# Patient Record
Sex: Male | Born: 1955 | Race: White | Hispanic: No | Marital: Married | Smoking: Former smoker
Health system: Southern US, Community
[De-identification: ages and names within clinical notes are randomized; demographics above are authoritative.]

## PROBLEM LIST (undated history)

## (undated) DIAGNOSIS — IMO0001 Reserved for inherently not codable concepts without codable children: Secondary | ICD-10-CM

## (undated) DIAGNOSIS — M1710 Unilateral primary osteoarthritis, unspecified knee: Secondary | ICD-10-CM

## (undated) DIAGNOSIS — R030 Elevated blood-pressure reading, without diagnosis of hypertension: Secondary | ICD-10-CM

## (undated) DIAGNOSIS — G8929 Other chronic pain: Secondary | ICD-10-CM

## (undated) DIAGNOSIS — I10 Essential (primary) hypertension: Secondary | ICD-10-CM

## (undated) DIAGNOSIS — IMO0002 Reserved for concepts with insufficient information to code with codable children: Secondary | ICD-10-CM

## (undated) DIAGNOSIS — F418 Other specified anxiety disorders: Secondary | ICD-10-CM

## (undated) DIAGNOSIS — M9909 Segmental and somatic dysfunction of abdomen and other regions: Secondary | ICD-10-CM

## (undated) DIAGNOSIS — M179 Osteoarthritis of knee, unspecified: Secondary | ICD-10-CM

## (undated) HISTORY — DX: Reserved for inherently not codable concepts without codable children: IMO0001

## (undated) HISTORY — DX: Segmental and somatic dysfunction of abdomen and other regions: M99.09

## (undated) HISTORY — DX: Elevated blood-pressure reading, without diagnosis of hypertension: R03.0

## (undated) HISTORY — DX: Other chronic pain: G89.29

## (undated) HISTORY — DX: Unilateral primary osteoarthritis, unspecified knee: M17.10

## (undated) HISTORY — DX: Reserved for concepts with insufficient information to code with codable children: IMO0002

## (undated) HISTORY — DX: Other specified anxiety disorders: F41.8

## (undated) HISTORY — DX: Osteoarthritis of knee, unspecified: M17.9

---

## 2006-01-19 ENCOUNTER — Ambulatory Visit (HOSPITAL_COMMUNITY): Admission: RE | Admit: 2006-01-19 | Discharge: 2006-01-19 | Payer: Self-pay | Admitting: Gastroenterology

## 2006-01-19 ENCOUNTER — Ambulatory Visit: Payer: Self-pay | Admitting: Gastroenterology

## 2006-01-19 ENCOUNTER — Encounter (INDEPENDENT_AMBULATORY_CARE_PROVIDER_SITE_OTHER): Payer: Self-pay | Admitting: Specialist

## 2007-02-23 ENCOUNTER — Encounter: Admission: RE | Admit: 2007-02-23 | Discharge: 2007-02-23 | Payer: Self-pay | Admitting: Sports Medicine

## 2007-03-19 ENCOUNTER — Encounter: Admission: RE | Admit: 2007-03-19 | Discharge: 2007-03-19 | Payer: Self-pay | Admitting: Sports Medicine

## 2010-11-15 NOTE — Op Note (Signed)
NAMEASHFORD, CLOUSE               ACCOUNT NO.:  [id number]   MEDICAL RECORD NO.:  [id number]          PATIENT TYPE:  AMB   LOCATION:  DAY                           FACILITY:  [facility]   PHYSICIAN:  Kassie Mends, M.D.      DATE OF BIRTH:  [birth year]   DATE OF PROCEDURE:  01/19/2006  DATE OF DISCHARGE:                                  PROCEDURE NOTE   REFERRING PHYSICIAN:  Dr. Charm Barges.   PROCEDURE:  Colonoscopy with cold forceps biopsy.   ENDOSCOPIST:  Kassie Mends, M.D.   INDICATION FOR EXAM:  Mr. Samuel Grant is a 55 year old male who has a first-degree  relative with colon cancer diagnosed at age greater than 15.  He has a  second-degree relative diagnosed with colon cancer at age greater than 63.   MEDICATIONS:  1.  Demerol 100 mg IV.  2.  Versed 8 mg IV.   FINDINGS:  1.  A 4-mm colon polyp removed via cold forceps biopsy.  2.  Sigmoid diverticulosis.  3.  No inflammatory changes, masses or vascular ectasias seen.  4.  Normal retroflexed view of the rectum.   RECOMMENDATIONS:  1.  Follow up biopsies.  Will need a screening colonoscopy in 5 years if      polyps are adenomatous.  Otherwise, he may be screened at an interval of      every 10 years.  2.  Handout given on high-fiber diet and diverticulosis.  3.  Follow up with Dr. Charm Barges.   PROCEDURAL TECHNIQUE:  Physical exam was performed and informed consent was  obtained from the patient after explaining all risks, benefits and  alternatives to the procedure, which the patient appeared to understand and  so stated.  The patient was connected to the monitoring device and placed in  the left lateral position.  Continuous oxygen was provided by nasal cannula  and IV medicine administered through an indwelling cannula.  After  administration of sedation and a rectal exam, the patient's rectum was  intubated and the scope was advanced under  direct visualization to the cecum.  Scope was subsequently removed slowly by  carefully examining  the color, texture, anatomy and integrity of the mucosa  on the way out.  The patient was recovered in the endoscopy suite and  discharged to home in satisfactory condition.      Kassie Mends, M.D.  Electronically Signed     SM/MEDQ  D:  01/20/2006  T:  01/21/2006  Job:  [id number]   cc:   Samuel Jester  Fax: [phone]

## 2012-12-22 ENCOUNTER — Encounter: Payer: Self-pay | Admitting: Cardiology

## 2012-12-28 ENCOUNTER — Encounter: Payer: Self-pay | Admitting: *Deleted

## 2012-12-28 ENCOUNTER — Ambulatory Visit (INDEPENDENT_AMBULATORY_CARE_PROVIDER_SITE_OTHER): Payer: BC Managed Care – PPO | Admitting: Cardiology

## 2012-12-28 ENCOUNTER — Encounter: Payer: Self-pay | Admitting: Cardiology

## 2012-12-28 VITALS — BP 124/80 | HR 76 | Ht 73.0 in | Wt 223.1 lb

## 2012-12-28 DIAGNOSIS — R0609 Other forms of dyspnea: Secondary | ICD-10-CM

## 2012-12-28 DIAGNOSIS — Z136 Encounter for screening for cardiovascular disorders: Secondary | ICD-10-CM

## 2012-12-28 DIAGNOSIS — G4733 Obstructive sleep apnea (adult) (pediatric): Secondary | ICD-10-CM

## 2012-12-28 DIAGNOSIS — M25519 Pain in unspecified shoulder: Secondary | ICD-10-CM

## 2012-12-28 DIAGNOSIS — R0602 Shortness of breath: Secondary | ICD-10-CM

## 2012-12-28 MED ORDER — ASPIRIN EC 81 MG PO TBEC: 81.0000 mg | DELAYED_RELEASE_TABLET | Freq: Every day | ORAL | Status: DC

## 2012-12-28 NOTE — Patient Instructions (Signed)
   Begin Aspirin 81mg  daily  Treadmill Myoview (stress test) - to be done at [facility] - to be done here in [place] Continue all other current medications. Follow up in  2 weeks

## 2012-12-28 NOTE — Progress Notes (Signed)
Patient ID: Samuel Grant, male   DOB: 1956/05/02, 57 y.o.   MRN: [id number] PCP: Dr. Charm Barges  57 yo with history of HTN presents for evaluation of exertional dyspnea.  Over the last 6 months, patient has developed exertional dyspnea.  He does not smoke.  He is short of breath after walking about 1 block or walking up an incline.  His wife has been noticing it as well.  No chest pain.  He does get pain in his left shoulder on occasion, usually when it is cold outside (history of shoulder surgery).  No chest pain.  No orthopnea/PND. No palpitations.  He does report fatigue during the day and daytime sleepiness. He does snore.  He tried his wife's CPAP but it did not help. He additionally has left knee pain and has been told that he will eventually need TKR.  Of note, his brother had an MI in his 68s.  No CAD history in parents.   ECG: NSR, normal  PMH: 1. Depression 2. HTN 3. Degenerative disc disease 4. OA knees 5. H/o shoulder surgery  SH: Lives in [place], nonsmoker, truck-driver, married  FH: Brother with MI in his 56s  ROS: All systems reviewed and negative except as per HPI  Current Outpatient Prescriptions  Medication Sig Dispense Refill  . ALPRAZolam (XANAX) 1 MG tablet Take 1 mg by mouth 4 (four) times daily.      . cyclobenzaprine (FLEXERIL) 10 MG tablet Take 10 mg by mouth 3 (three) times daily as needed for muscle spasms.      Marland Kitchen ibuprofen (ADVIL,MOTRIN) 800 MG tablet Take 800 mg by mouth 3 (three) times daily.      Marland Kitchen oxyCODONE-acetaminophen (PERCOCET) 10-325 MG per tablet Take 1 tablet by mouth every 4 (four) hours as needed for pain.      . ramipril (ALTACE) 5 MG capsule Take 5 mg by mouth daily.      . Vortioxetine HBr (BRINTELLIX) 10 MG TABS Take 10 mg by mouth daily.      Marland Kitchen aspirin EC 81 MG tablet Take 1 tablet (81 mg total) by mouth daily.       No current facility-administered medications for this visit.    BP 124/80  Pulse 76  Ht 6\' 1"  (1.854 m)  Wt 223 lb 1.9  oz (101.207 kg)  BMI 29.44 kg/m2 General: NAD Neck: No JVD, no thyromegaly or thyroid nodule.  Lungs: Clear to auscultation bilaterally with normal respiratory effort. CV: Nondisplaced PMI.  Heart regular S1/S2, soft S4, no murmur.  No peripheral edema.  No carotid bruit.  Normal pedal pulses.  Abdomen: Soft, nontender, no hepatosplenomegaly, no distention.  Skin: Intact without lesions or rashes.  Neurologic: Alert and oriented x 3.  Psych: Normal affect. Extremities: No clubbing or cyanosis.  HEENT: Normal.   Assessment/Plan: 1. Exertional dyspnea: Patient has new exertional dyspnea of uncertain etiology.  He has not gained a lot of weight recently.  He has not become more sedentary.  He is not volume overloaded on exam.  He does have cardiac risk factors: age, gender, family history, and HTN.   - Risk stratify with ETT-Myoview (thinks he will be able to walk).  - Echo given dyspnea - Would start ASA 81 mg daily - Will try to get copy of recent lipids from Dr. Charm Barges. 2. OSA: Suspected based on symptoms.  He does not want a sleep study at this time.   3. HTN: BP controlled on current regimen.   Treyvion Durkee  Shirlee Latch 12/28/2012

## 2012-12-30 ENCOUNTER — Other Ambulatory Visit: Payer: Self-pay

## 2012-12-30 ENCOUNTER — Other Ambulatory Visit (INDEPENDENT_AMBULATORY_CARE_PROVIDER_SITE_OTHER): Payer: BC Managed Care – PPO

## 2012-12-30 DIAGNOSIS — R0602 Shortness of breath: Secondary | ICD-10-CM

## 2013-01-03 ENCOUNTER — Ambulatory Visit (HOSPITAL_COMMUNITY): Payer: BC Managed Care – PPO | Attending: Cardiology | Admitting: Radiology

## 2013-01-03 ENCOUNTER — Encounter: Payer: Self-pay | Admitting: *Deleted

## 2013-01-03 VITALS — BP 125/80 | HR 72 | Ht 73.0 in | Wt 228.0 lb

## 2013-01-03 DIAGNOSIS — Z8249 Family history of ischemic heart disease and other diseases of the circulatory system: Secondary | ICD-10-CM | POA: Insufficient documentation

## 2013-01-03 DIAGNOSIS — Z01810 Encounter for preprocedural cardiovascular examination: Secondary | ICD-10-CM | POA: Insufficient documentation

## 2013-01-03 DIAGNOSIS — R0989 Other specified symptoms and signs involving the circulatory and respiratory systems: Secondary | ICD-10-CM | POA: Insufficient documentation

## 2013-01-03 DIAGNOSIS — R0789 Other chest pain: Secondary | ICD-10-CM | POA: Insufficient documentation

## 2013-01-03 DIAGNOSIS — R0602 Shortness of breath: Secondary | ICD-10-CM | POA: Insufficient documentation

## 2013-01-03 DIAGNOSIS — Z87891 Personal history of nicotine dependence: Secondary | ICD-10-CM | POA: Insufficient documentation

## 2013-01-03 DIAGNOSIS — R079 Chest pain, unspecified: Secondary | ICD-10-CM

## 2013-01-03 DIAGNOSIS — I10 Essential (primary) hypertension: Secondary | ICD-10-CM | POA: Insufficient documentation

## 2013-01-03 DIAGNOSIS — R5381 Other malaise: Secondary | ICD-10-CM | POA: Insufficient documentation

## 2013-01-03 DIAGNOSIS — M25519 Pain in unspecified shoulder: Secondary | ICD-10-CM | POA: Insufficient documentation

## 2013-01-03 DIAGNOSIS — R0609 Other forms of dyspnea: Secondary | ICD-10-CM | POA: Insufficient documentation

## 2013-01-03 MED ORDER — TECHNETIUM TC 99M SESTAMIBI GENERIC - CARDIOLITE
33.0000 | Freq: Once | INTRAVENOUS | Status: AC | PRN
  Administered 2013-01-03: 33 via INTRAVENOUS

## 2013-01-03 MED ORDER — TECHNETIUM TC 99M SESTAMIBI GENERIC - CARDIOLITE
11.0000 | Freq: Once | INTRAVENOUS | Status: AC | PRN
  Administered 2013-01-03: 11 via INTRAVENOUS

## 2013-01-03 NOTE — Progress Notes (Addendum)
[facility] SITE 3 NUCLEAR MED [address] [place], [place] [address] [phone]    Cardiology Nuclear Med Study  Samuel Grant is a 57 y.o. male     MRN : [id number]     DOB: 1956/03/29  Procedure Date: 01/03/2013  Nuclear Med Background Indication for Stress Test:  Evaluation for Ischemia and Pending Clearance for (L) Knee Surgery by Dr. Salvatore Marvel, Date:TBD History:  ~16 yrs ago GXT:OK per patient; 12/30/12 Echo:EF=60% Cardiac Risk Factors: Family History - CAD, History of Smoking and Hypertension  Symptoms:  Chest Tightness and (L) Shoulder Pain with and without Exertion (last episode of chest discomfort was about a week ago), DOE, Fatigue and SOB   Nuclear Pre-Procedure Caffeine/Decaff Intake:  None > 12 hrs NPO After: 5:30am sip 7-up   Lungs:  Clear. O2 Sat: 98% on room air. IV 0.9% NS with Angio Cath:  22g  IV Site: R Antecubital x 1, tolerated well IV Started by:  Samuel Hong, RN  Chest Size (in):  44 Cup Size: n/a  Height: 6\' 1"  (1.854 m)  Weight:  228 lb (103.42 kg)  BMI:  Body mass index is 30.09 kg/(m^2). Tech Comments:  Took Altace this am    Nuclear Med Study 1 or 2 day study: 1 day  Stress Test Type:  Stress  Reading MD: Samuel Clement, MD  Order Authorizing Provider:  Marca Ancona, MD  Resting Radionuclide: Technetium 72m Sestamibi  Resting Radionuclide Dose: 11.0 mCi   Stress Radionuclide:  Technetium 56m Sestamibi  Stress Radionuclide Dose: 33.0 mCi           Stress Protocol Rest HR: 72 Stress HR: 146  Rest BP: 125/80 Stress BP: 154/67  Exercise Time (min): 5:59 METS: 7.0   Predicted Max HR: 164 bpm % Max HR: 89.02 bpm Rate Pressure Product: 78469   Dose of Adenosine (mg):  n/a Dose of Lexiscan: n/a mg  Dose of Atropine (mg): n/a Dose of Dobutamine: n/a mcg/kg/min (at max HR)  Stress Test Technologist: Samuel Grant, CMA-N  Nuclear Technologist:  Samuel Grant, CNMT     Rest Procedure:  Myocardial perfusion imaging was  performed at rest 45 minutes following the intravenous administration of Technetium 41m Sestamibi.  Rest ECG: NSR - Normal EKG  Stress Procedure:  The patient exercised on the treadmill utilizing the Bruce Protocol for 5:59 minutes. The patient stopped due to fatigue, dyspnea and (B) calf pain, R>L.  He denied any chest pain.  Technetium 53m Sestamibi was injected at peak exercise and myocardial perfusion imaging was performed after a brief delay.  Stress ECG: No significant change from baseline ECG  QPS Raw Data Images:  Normal; no motion artifact; normal heart/lung ratio. Stress Images:  Normal homogeneous uptake in all areas of the myocardium. Rest Images:  Normal homogeneous uptake in all areas of the myocardium. Subtraction (SDS):  No evidence of ischemia. Transient Ischemic Dilatation (Normal <1.22):  n/a Lung/Heart Ratio (Normal <0.45):  0.46  Quantitative Gated Spect Images QGS EDV:  71 ml QGS ESV:  28 ml  Impression Exercise Capacity:  Fair exercise capacity. BP Response:  Normal blood pressure response. Clinical Symptoms:  No significant symptoms noted. ECG Impression:  No significant ST segment change suggestive of ischemia. Comparison with Prior Nuclear Study: No previous nuclear study performed  Overall Impression:  Normal stress nuclear study.  LV Ejection Fraction: 61%.  LV Wall Motion:  NL LV Function; NL Wall Motion   Samuel Grant  Normal study,  please inform patient.   Samuel Grant 01/04/2013

## 2013-01-05 NOTE — Progress Notes (Signed)
Patient notified and verbalized understanding.  Has follow up scheduled for July 18 with Gene Serpe, PA.

## 2013-01-14 ENCOUNTER — Encounter: Payer: BC Managed Care – PPO | Admitting: Physician Assistant

## 2013-01-14 NOTE — Progress Notes (Signed)
Primary Cardiologist: Marca Ancona, MD   HPI: Scheduled followup for review of recent diagnostic testing, per Dr. Shirlee Latch, arranged at time of recent OV for further evaluation of new onset DOE.   - Echocardiogram, July 3: EF 55-60%, no focal WMAs; no significant valvular abnormalities   - GXT Cardiolite, July 7: Normal stress nuclear study; EF 61%, NL wall motion  Allergies  Allergen Reactions  . Penicillins     Current Outpatient Prescriptions  Medication Sig Dispense Refill  . ALPRAZolam (XANAX) 1 MG tablet Take 1 mg by mouth 4 (four) times daily.      Marland Kitchen aspirin EC 81 MG tablet Take 1 tablet (81 mg total) by mouth daily.      . cyclobenzaprine (FLEXERIL) 10 MG tablet Take 10 mg by mouth 3 (three) times daily as needed for muscle spasms.      Marland Kitchen ibuprofen (ADVIL,MOTRIN) 800 MG tablet Take 800 mg by mouth 3 (three) times daily.      Marland Kitchen oxyCODONE-acetaminophen (PERCOCET) 10-325 MG per tablet Take 1 tablet by mouth every 4 (four) hours as needed for pain.      . ramipril (ALTACE) 5 MG capsule Take 5 mg by mouth daily.      . Vortioxetine HBr (BRINTELLIX) 10 MG TABS Take 10 mg by mouth daily.       No current facility-administered medications for this visit.    Past Medical History  Diagnosis Date  . DDD (degenerative disc disease)   . Depression with anxiety   . Chronic pain   . DJD (degenerative joint disease) of knee   . Elevated blood pressure   . Somatic dysfunction     No past surgical history on file.  History   Social History  . Marital Status: Married    Spouse Name: N/A    Number of Children: N/A  . Years of Education: N/A   Occupational History  . Not on file.   Social History Main Topics  . Smoking status: Former Smoker -- 3.00 packs/day for 20 years    Types: Cigarettes    Quit date: 12/28/1972  . Smokeless tobacco: Not on file  . Alcohol Use: No  . Drug Use: Not on file  . Sexually Active: Not on file   Other Topics Concern  . Not on file    Social History Narrative  . No narrative on file    No family history on file.  ROS: no nausea, vomiting; no fever, chills; no melena, hematochezia; no claudication  PHYSICAL EXAM: There were no vitals taken for this visit. GENERAL: 57 year old male; NAD HEENT: NCAT, PERRLA, EOMI; sclera clear; no xanthelasma NECK: palpable bilateral carotid pulses, no bruits; no JVD; no TM LUNGS: CTA bilaterally CARDIAC: RRR (S1, S2); no significant murmurs; no rubs or gallops ABDOMEN: soft, non-tender; intact BS EXTREMETIES: intact distal pulses; no significant peripheral edema SKIN: warm/dry; no obvious rash/lesions MUSCULOSKELETAL: no joint deformity NEURO: no focal deficit; NL affect   EKG: reviewed and available in Electronic Records   ASSESSMENT & PLAN:  No problem-specific assessment & plan notes found for this encounter.   Gene Mirielle Byrum, PAC

## 2015-12-05 ENCOUNTER — Telehealth: Payer: Self-pay | Admitting: Gastroenterology

## 2015-12-05 NOTE — Telephone Encounter (Signed)
Pt is on the July recall for his 10 yr colonoscopy °

## 2015-12-05 NOTE — Telephone Encounter (Signed)
Letter mailed to pt.  

## 2017-09-15 ENCOUNTER — Emergency Department (HOSPITAL_COMMUNITY)
Admission: EM | Admit: 2017-09-15 | Discharge: 2017-09-15 | Disposition: A | Discharge status: Home / Self Care | Payer: Worker's Compensation | Attending: Emergency Medicine | Admitting: Emergency Medicine

## 2017-09-15 ENCOUNTER — Other Ambulatory Visit: Payer: Self-pay

## 2017-09-15 ENCOUNTER — Emergency Department (HOSPITAL_COMMUNITY): Payer: Worker's Compensation

## 2017-09-15 ENCOUNTER — Encounter (HOSPITAL_COMMUNITY): Payer: Self-pay | Admitting: Emergency Medicine

## 2017-09-15 DIAGNOSIS — Z23 Encounter for immunization: Secondary | ICD-10-CM | POA: Insufficient documentation

## 2017-09-15 DIAGNOSIS — Y9389 Activity, other specified: Secondary | ICD-10-CM | POA: Insufficient documentation

## 2017-09-15 DIAGNOSIS — S2242XA Multiple fractures of ribs, left side, initial encounter for closed fracture: Secondary | ICD-10-CM | POA: Insufficient documentation

## 2017-09-15 DIAGNOSIS — Y92488 Other paved roadways as the place of occurrence of the external cause: Secondary | ICD-10-CM | POA: Insufficient documentation

## 2017-09-15 DIAGNOSIS — S299XXA Unspecified injury of thorax, initial encounter: Secondary | ICD-10-CM | POA: Insufficient documentation

## 2017-09-15 DIAGNOSIS — S0181XA Laceration without foreign body of other part of head, initial encounter: Secondary | ICD-10-CM | POA: Insufficient documentation

## 2017-09-15 DIAGNOSIS — Y999 Unspecified external cause status: Secondary | ICD-10-CM | POA: Diagnosis not present

## 2017-09-15 DIAGNOSIS — S4991XA Unspecified injury of right shoulder and upper arm, initial encounter: Secondary | ICD-10-CM | POA: Diagnosis present

## 2017-09-15 HISTORY — DX: Essential (primary) hypertension: I10

## 2017-09-15 MED ORDER — HYDROCODONE-ACETAMINOPHEN 5-325 MG PO TABS: 2.0000 | ORAL_TABLET | ORAL | 0 refills | Status: DC | PRN

## 2017-09-15 MED ORDER — BACITRACIN ZINC 500 UNIT/GM EX OINT
TOPICAL_OINTMENT | CUTANEOUS | Status: AC
  Filled 2017-09-15: qty 0.9

## 2017-09-15 MED ORDER — OXYCODONE-ACETAMINOPHEN 5-325 MG PO TABS
2.0000 | ORAL_TABLET | Freq: Once | ORAL | Status: AC
  Administered 2017-09-15: 2 via ORAL
  Filled 2017-09-15: qty 2

## 2017-09-15 MED ORDER — IBUPROFEN 600 MG PO TABS: 600.0000 mg | ORAL_TABLET | Freq: Four times a day (QID) | ORAL | 0 refills | Status: AC | PRN

## 2017-09-15 MED ORDER — TETANUS-DIPHTH-ACELL PERTUSSIS 5-2.5-18.5 LF-MCG/0.5 IM SUSP
0.5000 mL | Freq: Once | INTRAMUSCULAR | Status: AC
  Administered 2017-09-15: 0.5 mL via INTRAMUSCULAR
  Filled 2017-09-15: qty 0.5

## 2017-09-15 MED ORDER — LIDOCAINE-EPINEPHRINE 2 %-1:100000 IJ SOLN
10.0000 mL | Freq: Once | INTRAMUSCULAR | Status: DC
  Filled 2017-09-15: qty 10

## 2017-09-15 MED ORDER — LIDOCAINE-EPINEPHRINE (PF) 2 %-1:200000 IJ SOLN
INTRAMUSCULAR | Status: AC
  Filled 2017-09-15: qty 20
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
## 2017-09-15 NOTE — Discharge Instructions (Signed)
Please use the breathing incentive spirometer every hour to help increase your lung volume and avoid pneumonia  Ibuprofen as needed for pain, hydrocodone for severe pain  Emergency department for high fevers increasing chest pain or shortness of breath.  You will need to see her family doctor for ongoing treatment of your rib fractures, these may take up to 6 weeks to heal.  The stitches will need to come out within 5 days.  You may go to your family doctor to have these removed.  If you see increased redness swelling pain or fevers you should seek medical attention immediately.  Please keep topical antibiotics on this wound.

## 2017-09-15 NOTE — ED Notes (Signed)
Antibiotic ointment and dressing applied to forehead.  Pt no distress.
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
## 2017-10-01 ENCOUNTER — Encounter (HOSPITAL_COMMUNITY): Payer: Self-pay | Admitting: *Deleted

## 2017-10-01 ENCOUNTER — Other Ambulatory Visit: Payer: Self-pay

## 2017-10-01 ENCOUNTER — Emergency Department (HOSPITAL_COMMUNITY)
Admission: EM | Admit: 2017-10-01 | Discharge: 2017-10-01 | Disposition: A | Discharge status: Home / Self Care | Payer: Worker's Compensation | Attending: Emergency Medicine | Admitting: Emergency Medicine

## 2017-10-01 ENCOUNTER — Emergency Department (HOSPITAL_COMMUNITY): Payer: Worker's Compensation

## 2017-10-01 DIAGNOSIS — Y9389 Activity, other specified: Secondary | ICD-10-CM | POA: Insufficient documentation

## 2017-10-01 DIAGNOSIS — S42102A Fracture of unspecified part of scapula, left shoulder, initial encounter for closed fracture: Secondary | ICD-10-CM

## 2017-10-01 DIAGNOSIS — Y92410 Unspecified street and highway as the place of occurrence of the external cause: Secondary | ICD-10-CM | POA: Diagnosis not present

## 2017-10-01 DIAGNOSIS — S2242XD Multiple fractures of ribs, left side, subsequent encounter for fracture with routine healing: Secondary | ICD-10-CM | POA: Insufficient documentation

## 2017-10-01 DIAGNOSIS — S4992XA Unspecified injury of left shoulder and upper arm, initial encounter: Secondary | ICD-10-CM | POA: Diagnosis present

## 2017-10-01 DIAGNOSIS — Z87891 Personal history of nicotine dependence: Secondary | ICD-10-CM | POA: Insufficient documentation

## 2017-10-01 DIAGNOSIS — Y990 Civilian activity done for income or pay: Secondary | ICD-10-CM | POA: Insufficient documentation

## 2017-10-01 MED ORDER — IBUPROFEN 800 MG PO TABS
800.0000 mg | ORAL_TABLET | Freq: Once | ORAL | Status: AC
  Administered 2017-10-01: 800 mg via ORAL
  Filled 2017-10-01: qty 1

## 2017-10-01 MED ORDER — MELOXICAM 15 MG PO TABS: 15.0000 mg | ORAL_TABLET | Freq: Every day | ORAL | 0 refills | Status: DC

## 2017-10-01 MED ORDER — DIAZEPAM 5 MG PO TABS
10.0000 mg | ORAL_TABLET | Freq: Once | ORAL | Status: AC
Start: 2017-10-01 — End: 2017-10-01
  Administered 2017-10-01: 10 mg via ORAL
  Filled 2017-10-01: qty 2

## 2017-10-01 MED ORDER — CYCLOBENZAPRINE HCL 10 MG PO TABS: 10.0000 mg | ORAL_TABLET | Freq: Three times a day (TID) | ORAL | 0 refills | Status: DC

## 2017-10-01 MED ORDER — HYDROCODONE-ACETAMINOPHEN 7.5-325 MG PO TABS: 1.0000 | ORAL_TABLET | ORAL | 0 refills | Status: DC | PRN

## 2017-10-01 MED ORDER — ONDANSETRON HCL 4 MG PO TABS
4.0000 mg | ORAL_TABLET | Freq: Once | ORAL | Status: AC
  Administered 2017-10-01: 4 mg via ORAL
  Filled 2017-10-01: qty 1

## 2017-10-01 MED ORDER — HYDROCODONE-ACETAMINOPHEN 5-325 MG PO TABS
2.0000 | ORAL_TABLET | Freq: Once | ORAL | Status: AC
  Administered 2017-10-01: 2 via ORAL
  Filled 2017-10-01: qty 2

## 2017-10-01 NOTE — Discharge Instructions (Addendum)
Your oxygen level is 97% on room air.  Your blood pressure is elevated at 153/115.  Please have this rechecked soon.  The CT scan of your chest confirms the fracture of the second and third rib on the left, similar to what was seen on your study initially.  There is also noted a nondisplaced fracture of your shoulder blade (scapula).  Please use your sling until seen by orthopedics, Dr. Romeo AppleHarrison.  Please use Flexeril 3 times daily, use Mobic daily with food.  Use Norco every 4-6 hours as needed for more severe pain.  Flexeril and Norco may cause drowsiness.  Please do not drive a vehicle, operate machinery, drink alcohol, handle illegal documents, or participate in activities requiring concentration when taking either these medications.  Please see Dr. Charm BargesButler for additional evaluation and pain control if needed.
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
## 2017-10-05 ENCOUNTER — Telehealth: Payer: Self-pay | Admitting: Orthopedic Surgery
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
## 2017-10-07 NOTE — Telephone Encounter (Signed)
Appointment has been scheduled accordingly with Worker's comp adjuster noted; patient aware (via voice message - currently receiving messages at an alternate ph# [phone]

## 2017-10-07 NOTE — Telephone Encounter (Signed)
What?

## 2017-10-07 NOTE — Telephone Encounter (Signed)
Reviewed with Dr Romeo AppleHarrison. Approved for scheduling Tuesday, 10/13/17. Called back to worker's comp adjuster  Sammie Benchina Greene at Ph# [phone], left voice message to return call to set up appointment; patient to be called following receipt of all worker's comp claim information and set up forms.  Faxed to [phone].

## 2017-10-13 ENCOUNTER — Encounter: Payer: Self-pay | Admitting: Orthopedic Surgery

## 2017-10-13 ENCOUNTER — Telehealth: Payer: Self-pay | Admitting: Radiology

## 2017-10-13 ENCOUNTER — Ambulatory Visit (INDEPENDENT_AMBULATORY_CARE_PROVIDER_SITE_OTHER): Payer: Worker's Compensation | Admitting: Orthopedic Surgery

## 2017-10-13 VITALS — BP 160/104 | HR 70 | Ht 72.0 in | Wt 227.0 lb

## 2017-10-13 DIAGNOSIS — S42115A Nondisplaced fracture of body of scapula, left shoulder, initial encounter for closed fracture: Secondary | ICD-10-CM | POA: Diagnosis not present

## 2017-10-13 MED ORDER — MELOXICAM 15 MG PO TABS: 15.0000 mg | ORAL_TABLET | Freq: Every day | ORAL | 0 refills | Status: DC
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
## 2017-10-13 NOTE — Telephone Encounter (Signed)
Yes, contacted Sammie Benchina Greene, Adjuster, by phone [phone] and fax [phone], notes included for requesting of physical therapy/ ref# Claim# [id number].

## 2017-10-13 NOTE — Telephone Encounter (Signed)
Appointment completed as scheduled today, 10/13/17.

## 2017-10-13 NOTE — Patient Instructions (Signed)
Continue meloxicam  Call us Monday before you run out of the pain pill for a prescription for hydrocodone  We will schedule you for physical therapy left shoulder once we get approval from your employer's insurance  Out of work 6 weeks  Return in 6 weeks  MMI expected 8 weeks.

## 2017-10-13 NOTE — Telephone Encounter (Signed)
Patient needs physical therapy for his scapular fracture, will you work on approval for this since this is a WC claim ?  Once you get the approval let me know where to send the order  3x weekly for 6 weeks

## 2017-10-21 ENCOUNTER — Telehealth: Payer: Self-pay | Admitting: Orthopedic Surgery

## 2017-10-21 NOTE — Telephone Encounter (Signed)
Samuel Grant called this morning wanting Dr. Romeo AppleHarrison to give approval for him to get pain medication.  He said he didn't understand why the pharmacy wouldn't fill it for him.  He said he got prescriptions for pain medications from the ER on 10/01/17 for Hydrocodone 7.5-325 mgs. and again on 10/13/17 for Oxycodone/Acetaminophen 10-325 mgs.     I called and spoke to the pharmacist, Arlys JohnBrian at [facility] in [place]. (This is the pharmacy that Samuel Grant states he uses.)  Arlys JohnBrian said that Samuel Grant got a prescription for Oxycodone filled on 10/01/17 that was written by his PCP, Dr. Charm BargesButler.  The prescription was written for 40 tablets and it is to last 30 days.    I called Samuel Grant back and told him what Arlys JohnBrian from the [facility] said.

## 2017-11-04 ENCOUNTER — Telehealth: Payer: Self-pay | Admitting: Orthopedic Surgery

## 2017-11-04 NOTE — Telephone Encounter (Signed)
Call from Italy Parker, physical therapist, [facility] in [place] / ph# [phone]- states patient approved per Workers comp and seen for initial visit today, 11/04/17. Asking where exactly the scapular fracture is, and how much separation is there.  We have faxed copy of the notes as well. Please call his direct ph#[phone]

## 2017-11-04 NOTE — Telephone Encounter (Signed)
I have called to advise.  

## 2017-11-19 DIAGNOSIS — S42102A Fracture of unspecified part of scapula, left shoulder, initial encounter for closed fracture: Secondary | ICD-10-CM | POA: Insufficient documentation

## 2017-11-24 ENCOUNTER — Ambulatory Visit: Payer: Self-pay | Admitting: Orthopedic Surgery

## 2017-11-24 ENCOUNTER — Telehealth: Payer: Self-pay | Admitting: Orthopedic Surgery
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
## 2017-11-26 NOTE — Telephone Encounter (Signed)
Called back to patient to follow up - said able to come in tomorrow, 11/27/17 - will call back shortly for a time, as he was at another doctor appointment.

## 2017-11-26 NOTE — Telephone Encounter (Signed)
I called patient back at 12:03pm 11/26/17 to relay the time for tomorrow's appointment. Left voice message to return call.

## 2017-11-27 ENCOUNTER — Encounter: Payer: Self-pay | Admitting: Orthopedic Surgery

## 2017-11-27 ENCOUNTER — Ambulatory Visit (INDEPENDENT_AMBULATORY_CARE_PROVIDER_SITE_OTHER): Payer: Worker's Compensation | Admitting: Orthopedic Surgery

## 2017-11-27 VITALS — BP 125/83 | HR 88 | Ht 72.0 in | Wt 227.0 lb

## 2017-11-27 DIAGNOSIS — S42155D Nondisplaced fracture of neck of scapula, left shoulder, subsequent encounter for fracture with routine healing: Secondary | ICD-10-CM

## 2017-11-27 NOTE — Progress Notes (Signed)
Fracture care follow-up  Chief Complaint  Patient presents with  . Follow-up    Recheck on left scapular fracture, DOI 09-15-17. Worker's Comp.    Encounter Diagnosis  Name Primary?  . Closed nondisplaced fracture of neck of left scapula with routine healing, subsequent encounter 09/15/17 Yes     Current Outpatient Medications:  .  ALPRAZolam (XANAX) 1 MG tablet, Take 1 mg by mouth 4 (four) times daily., Disp: , Rfl:  .  cyclobenzaprine (FLEXERIL) 10 MG tablet, Take 1 tablet (10 mg total) by mouth 3 (three) times daily., Disp: 20 tablet, Rfl: 0 .  HYDROcodone-acetaminophen (NORCO) 7.5-325 MG tablet, Take 1 tablet by mouth every 4 (four) hours as needed., Disp: 15 tablet, Rfl: 0 .  ibuprofen (ADVIL,MOTRIN) 600 MG tablet, Take 1 tablet (600 mg total) by mouth every 6 (six) hours as needed., Disp: 30 tablet, Rfl: 0 .  oxyCODONE-acetaminophen (PERCOCET) 10-325 MG per tablet, Take 1 tablet by mouth every 4 (four) hours as needed for pain., Disp: , Rfl:  .  ramipril (ALTACE) 5 MG capsule, Take 5 mg by mouth daily., Disp: , Rfl:  .  meloxicam (MOBIC) 15 MG tablet, Take 1 tablet (15 mg total) by mouth daily. (Patient not taking: Reported on 11/27/2017), Disp: 30 tablet, Rfl: 0   Scapular fracture healing appropriately PT notes indicate he is at 50% range of motion 50% ADL 50% in terms of pain level    BP 125/83   Pulse 88   Ht 6' (1.829 m)   Wt 227 lb (103 kg)   BMI 30.79 kg/m   Physical Exam No significant tenderness around the scapula is passive range of motion has returned to normal he has weakness globally around the shoulder  Xrays: Next visit  Plan   Continue physical therapy follow-up June 16 with a return to work date hopefully June 19

## 2017-12-14 ENCOUNTER — Ambulatory Visit (INDEPENDENT_AMBULATORY_CARE_PROVIDER_SITE_OTHER): Payer: Worker's Compensation | Admitting: Orthopedic Surgery

## 2017-12-14 ENCOUNTER — Ambulatory Visit (INDEPENDENT_AMBULATORY_CARE_PROVIDER_SITE_OTHER): Payer: Worker's Compensation

## 2017-12-14 ENCOUNTER — Encounter: Payer: Self-pay | Admitting: Orthopedic Surgery

## 2017-12-14 ENCOUNTER — Telehealth: Payer: Self-pay | Admitting: Radiology

## 2017-12-14 VITALS — BP 135/81 | HR 91 | Ht 72.0 in | Wt 235.0 lb

## 2017-12-14 DIAGNOSIS — S42155D Nondisplaced fracture of neck of scapula, left shoulder, subsequent encounter for fracture with routine healing: Secondary | ICD-10-CM

## 2017-12-14 NOTE — Telephone Encounter (Signed)
Faxed orders and note from today to physical therapy, [phone] Dr Romeo AppleHarrison states "lets get this moving" wants therapy to be more aggressive.

## 2017-12-14 NOTE — Progress Notes (Signed)
Fracture care follow-up  Left scapular fracture  Patient comes in today for follow-up and x-ray  X-ray shows the fracture is minimally displaced is healed  His exam shows no tenderness at the fracture site but his shoulder abduction is only 100 degrees  New orders placed to the PT department let us get this going is 90 days his range of motion needs to be advanced as tolerated with strengthening as tolerated  He is out of work for a month follow-up in a month  He is out of work because he has not regained his full range of motion and strength in his left shoulder

## 2017-12-14 NOTE — Patient Instructions (Signed)
Out of work 1 month

## 2018-01-13 ENCOUNTER — Encounter: Payer: Self-pay | Admitting: Orthopedic Surgery

## 2018-01-13 ENCOUNTER — Ambulatory Visit (INDEPENDENT_AMBULATORY_CARE_PROVIDER_SITE_OTHER): Payer: Worker's Compensation | Admitting: Orthopedic Surgery

## 2018-01-13 VITALS — BP 161/101 | HR 74 | Ht 72.0 in | Wt 237.0 lb

## 2018-01-13 DIAGNOSIS — S42155D Nondisplaced fracture of neck of scapula, left shoulder, subsequent encounter for fracture with routine healing: Secondary | ICD-10-CM | POA: Diagnosis not present
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
## 2018-01-14 ENCOUNTER — Encounter: Payer: Self-pay | Admitting: Orthopedic Surgery

## 2018-02-24 ENCOUNTER — Telehealth: Payer: Self-pay | Admitting: Orthopedic Surgery

## 2018-02-24 NOTE — Telephone Encounter (Signed)
Patient called on 02/23/18 to relay that Worker's comp has set up a second opinion [place]opaedic [place] in [place] for 02/25/18. Wanted to verify where to request copy of his Xray films for the appointment. Relayed.
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
## 2024-04-06 ENCOUNTER — Other Ambulatory Visit (HOSPITAL_COMMUNITY): Payer: Self-pay

## 2024-04-06 ENCOUNTER — Encounter: Payer: Self-pay | Admitting: Cardiology

## 2024-04-06 ENCOUNTER — Ambulatory Visit: Attending: Cardiology | Admitting: Cardiology

## 2024-04-06 VITALS — BP 159/89 | HR 96 | Resp 16 | Ht 72.0 in | Wt 266.0 lb

## 2024-04-06 DIAGNOSIS — Z6836 Body mass index (BMI) 36.0-36.9, adult: Secondary | ICD-10-CM

## 2024-04-06 DIAGNOSIS — R7303 Prediabetes: Secondary | ICD-10-CM | POA: Diagnosis not present

## 2024-04-06 DIAGNOSIS — R0602 Shortness of breath: Secondary | ICD-10-CM

## 2024-04-06 DIAGNOSIS — E66812 Obesity, class 2: Secondary | ICD-10-CM

## 2024-04-06 DIAGNOSIS — I209 Angina pectoris, unspecified: Secondary | ICD-10-CM

## 2024-04-06 DIAGNOSIS — Z87891 Personal history of nicotine dependence: Secondary | ICD-10-CM

## 2024-04-06 DIAGNOSIS — I10 Essential (primary) hypertension: Secondary | ICD-10-CM

## 2024-04-06 DIAGNOSIS — E6609 Other obesity due to excess calories: Secondary | ICD-10-CM

## 2024-04-06 LAB — LDL CHOLESTEROL, DIRECT

## 2024-04-06 MED ORDER — METOPROLOL SUCCINATE ER 25 MG PO TB24
25.0000 mg | ORAL_TABLET | Freq: Every morning | ORAL | 3 refills | Status: AC
  Filled 2024-04-06: qty 90, 90d supply, fill #0

## 2024-04-06 MED ORDER — ISOSORBIDE MONONITRATE ER 30 MG PO TB24
30.0000 mg | ORAL_TABLET | Freq: Every evening | ORAL | 3 refills | Status: AC
  Filled 2024-04-06 (×2): qty 90, 90d supply, fill #0

## 2024-04-06 MED ORDER — NITROGLYCERIN 0.4 MG SL SUBL
0.4000 mg | SUBLINGUAL_TABLET | SUBLINGUAL | 3 refills | Status: AC | PRN
  Filled 2024-04-06: qty 25, 8d supply, fill #0

## 2024-04-06 MED ORDER — ASPIRIN 81 MG PO TBEC
81.0000 mg | DELAYED_RELEASE_TABLET | Freq: Every day | ORAL | 12 refills | Status: AC
  Filled 2024-04-06: qty 30, 30d supply, fill #0

## 2024-04-06 NOTE — Progress Notes (Signed)
 Cardiology Office Note:    Date:  04/06/2024  NAME:  Samuel Grant    MRN: [id number] DOB:  1956/02/21   PCP:  Renato Dorothey HERO, NP  Former Cardiology Providers: Dr. Ezra Shuck Primary Cardiologist:  Madonna Large, DO, [facility] (established care 04/06/2024) Electrophysiologist:  None   Referring MD: Towana Montie SQUIBB, DO  Reason of Consult: Chest pain   Chief Complaint  Patient presents with   New Patient (Initial Visit)    Chest pain and shortness of breath    History of Present Illness:    Samuel Grant is a 68 y.o. Caucasian male whose past medical history and cardiovascular risk factors includes: Hypertension, family history of premature coronary artery disease, degenerative disc disease, depression/anxiety, sleep difficulties, bruxism. He is being seen today for the evaluation of chest pain and shortness of breath at the request of Towana Montie SQUIBB, DO.  Chest Pain:  Left sided and substernal Onging for 3 months; no change in intensity and duration But more frequent now than three months ago No ER visit states I am hard headed / stubborn. Intensity: 5/10 Duration: minutes  Worse: with exertion (was walking 3 miles last year, now only able to walk 0.5 miles before he becomes symptomatic) Better: with resting and at times self-limited. Last episode yesterday night.  Shortness of breath:  Duration: 4 months  + PND, orthopnea,  No leg swelling  No history of congestive heart failure, remote history of smoking Primary referred him for sleep apnea but patient states his out-of-pocket portion is cost prohibitive  Notes elevated BP at home 140-160 mmHG.   Patient states that his brother was born prematurely, had congenital heart disease, had to undergo multiple surgeries, and passed away in his early 32s.    Current Medications: Current Meds  Medication Sig   ALPRAZolam (XANAX) 1 MG tablet Take 1 mg by mouth 4 (four) times daily.   amLODipine (NORVASC) 10 MG  tablet Take 10 mg by mouth daily.   aspirin  EC 81 MG tablet Take 1 tablet (81 mg total) by mouth daily. Swallow whole.   budesonide-glycopyrrolate-formoterol (BREZTRI AEROSPHERE) 160-9-4.8 MCG/ACT AERO inhaler Inhale 2 puffs into the lungs.   chlorthalidone (HYGROTON) 25 MG tablet Take 25 mg by mouth daily.   cyclobenzaprine  (FLEXERIL ) 10 MG tablet Take 10 mg by mouth at bedtime.   ibuprofen  (ADVIL ,MOTRIN ) 600 MG tablet Take 1 tablet (600 mg total) by mouth every 6 (six) hours as needed.   isosorbide mononitrate (IMDUR) 30 MG 24 hr tablet Take 1 tablet (30 mg total) by mouth at bedtime.   metoprolol succinate (TOPROL XL) 25 MG 24 hr tablet Take 1 tablet (25 mg total) by mouth every morning.   nitroGLYCERIN (NITROSTAT) 0.4 MG SL tablet Place 1 tablet (0.4 mg total) under the tongue every 5 (five) minutes as needed for chest pain.     Allergies:    Penicillins   Past Medical History: Past Medical History:  Diagnosis Date   Chronic pain    DDD (degenerative disc disease)    Depression with anxiety    DJD (degenerative joint disease) of knee    Elevated blood pressure    Hypertension    Somatic dysfunction     Past Surgical History: History reviewed. No pertinent surgical history. Shoulder surgery   Social History: Social History   Tobacco Use   Smoking status: Former    Current packs/day: 0.00    Average packs/day: 3.0 packs/day for 20.0 years (60.0 ttl  pk-yrs)    Types: Cigarettes    Start date: 12/28/1952    Quit date: 12/28/1972    Years since quitting: 51.3   Smokeless tobacco: Never  Substance Use Topics   Alcohol use: No   Drug use: No    Family History: Family History  Problem Relation Age of Onset   Healthy Mother    Tuberculosis Father     ROS:   Review of Systems  Cardiovascular:  Positive for chest pain and dyspnea on exertion. Negative for claudication, irregular heartbeat, leg swelling, near-syncope, orthopnea, palpitations, paroxysmal nocturnal dyspnea  and syncope.  Respiratory:  Positive for shortness of breath.   Hematologic/Lymphatic: Negative for bleeding problem.    EKGs/Labs/Other Studies Reviewed:   EKG: EKG Interpretation Date/Time:  Wednesday April 06 2024 10:34:23 EDT Ventricular Rate:  98 PR Interval:  160 QRS Duration:  88 QT Interval:  358 QTC Calculation: 457 R Axis:   70  Text Interpretation: Normal sinus rhythm When compared with ECG of 03-Jan-2013 11:05, Since last tracing rate slower Confirmed by Michele Richardson [phone]) on 04/06/2024 11:01:36 AM  Echocardiogram: 12/2012: LVEF 55 to 60%, no significant valvular heart disease, see report for additional details   Labs: Hemoglobin A1c Reviewed date:03/09/2024 10:53:16 AM Interpretation: Performing Oja:Ojarn[facility] [place], [address], [place], Phone - [phone], Director - MDNagendra Notes/Report:  Hemoglobin A1c 6.2 4.8-5.6 % .  Prediabetes: 5.7 - 6.4  Diabetes: >6.4  Glycemic control for adults with diabetes: <7.0   CBC With Differential/Platelet Reviewed date:03/09/2024 10:53:16 AM Interpretation: Performing Oja:Ojarnme [place], [address], [place], Phone - [phone], Director - MDNagendra Notes/Report:  WBC 8.2 3.4-10.8 x10E3/uL    RBC 5.45 4.14-5.80 x10E6/uL    Hemoglobin 16.9 13.0-17.7 g/dL    Hematocrit 49.7 62.4-48.9 %    MCV 92 79-97 fL    MCH 31.0 26.6-33.0 pg    MCHC 33.7 31.5-35.7 g/dL    RDW 87.1 88.3-84.5 %    Platelets 419 150-450 x10E3/uL    Neutrophils 47 Not Estab. %    Lymphs 36 Not Estab. %    Monocytes 9 Not Estab. %    Eos 7 Not Estab. %    Basos 1 Not Estab. %    Immature Cells        Neutrophils (Absolute) 3.9 1.4-7.0 x10E3/uL    Lymphs (Absolute) 2.9 0.7-3.1 x10E3/uL    Monocytes(Absolute) 0.7 0.1-0.9 x10E3/uL    Eos (Absolute) 0.5 0.0-0.4 x10E3/uL    Baso (Absolute) 0.1 0.0-0.2 x10E3/uL    Immature Granulocytes 0 Not Estab. %    Immature Grans (Abs) 0.0 0.0-0.1 x10E3/uL    NRBC        Hematology  Comments:        Comp. Metabolic Panel (14) Reviewed date:03/09/2024 10:53:16 AM Interpretation: Performing Oja:Ojarnme [place], [address], [place], Phone - [phone], Director - MDNagendra Notes/Report:  Glucose 112 70-99 mg/dL    BUN 17 1-72 mg/dL    Creatinine 9.04 9.23-8.72 mg/dL    eGFR 88 >40 fO/fpw/8.26    BUN/Creatinine Ratio 18 10-24    Sodium 138 134-144 mmol/L    Potassium 3.8 3.5-5.2 mmol/L    Chloride 94 96-106 mmol/L    Carbon Dioxide, Total 27 20-29 mmol/L    Calcium 9.8 8.6-10.2 mg/dL    Protein, Total 7.8 3.9-1.4 g/dL    Albumin 4.6 6.0-5.0 g/dL    Globulin, Total 3.2 1.5-4.5 g/dL    Bilirubin, Total 0.5 0.0-1.2 mg/dL    Alkaline Phosphatase 77 44-121 IU/L **Effective March 14, 2024  Alkaline Phosphatase**  reference interval will be changing to:  Age Male Male  0 - 5 days 47 - 127 47 - 127  6 - 10 days 29 - 242 29 - 242  11 - 20 days 109 - 357 109 - 357  21 - 30 days 94 - 494 94 - 494  1 - 2 months 149 - 539 149 - 539  3 - 6 months 131 - 452 131 - 452  7 - 11 months 117 - 401 117 - 401  12 months - 6 years 158 - 369 158 - 369  7 - 12 years 150 - 409 150 - 409  13 years 156 - 435 78 - 227  14 years 114 - 375 64 - 161  15 years 88 - 279 56 - 134  16 years 74 - 207 51 - 121  17 years 63 - 161 47 - 113  18 - 20 years 51 - 125 42 - 106  21 - 50 years 47 - 123 41 - 116  51 - 80 years 49 - 135 51 - 125  >80 years 48 - 129 48 - 129   AST (SGOT) 24 0-40 IU/L    ALT (SGPT) 32 0-44 IU/L      Physical Exam:    Today's Vitals   04/06/24 1037  BP: (!) 159/89  Pulse: 96  Resp: 16  SpO2: 96%  Weight: 266 lb (120.7 kg)  Height: 6' (1.829 m)   Body mass index is 36.08 kg/m. Wt Readings from Last 3 Encounters:  04/06/24 266 lb (120.7 kg)  01/13/18 237 lb (107.5 kg)  12/14/17 235 lb (106.6 kg)    Physical Exam  Constitutional: No distress.  hemodynamically stable  Neck: No JVD present.  Cardiovascular: Normal rate, regular rhythm, S1  normal and S2 normal. Exam reveals no gallop, no S3 and no S4.  No murmur heard. Pulmonary/Chest: Effort normal and breath sounds normal. No stridor. He has no wheezes. He has no rales.  Musculoskeletal:        General: No edema.     Cervical back: Neck supple.  Skin: Skin is warm.     Impression & Recommendation(s):  Impression:   ICD-10-CM   1. Angina pectoris  I20.9 EKG 12-Lead    Comp Met (CMET)    CBC    Lipid Profile    Lipoprotein A (LPA)    Direct LDL    ECHOCARDIOGRAM COMPLETE    2. Shortness of breath  R06.02 Comp Met (CMET)    CBC    Lipid Profile    Lipoprotein A (LPA)    Direct LDL    ECHOCARDIOGRAM COMPLETE    3. Benign hypertension  I10     4. Prediabetes  R73.03     5. Former smoker  Z87.891     6. Class 2 obesity due to excess calories without serious comorbidity with body mass index (BMI) of 36.0 to 36.9 in adult  E66.812    E66.09    Z68.36        Recommendation(s):  Angina pectoris Shortness of breath Symptoms concerning for cardiac discomfort. Ongoing since July 2025, progressive EKG is nonischemic. Risk factors: Former smoker, prediabetes, family history Recommended coronary CTA versus left heart catheterization; however, patient would like to proceed with the latter to make sure he does not have obstructive disease and if needed avoid a second procedure.  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 Risks, benefits, and alternatives, and limitations discussed. Start aspirin  81 mg p.o. daily until the workup is incomplete. Start Toprol-XL 25 mg p.o. every morning. Start Imdur 30 mg p.o. every afternoon Sublingual nitroglycerin tablets to use on a as needed basis, patient is advised to seek medical attention after the second sublingual nitroglycerin tablet. Echo will be ordered to evaluate for structural heart disease and left ventricular systolic function. Left heart catheterization with possible  intervention Patient is nonfasting and lives in [place]  approximately 40 to 45 minutes away.  Patient is requesting labs to be done today as opposed to coming back.  Check CMP, lipids, direct LDL, LP(a), and CBC. Recommended that he follows up with PCP for evaluation of noncardiac causes of shortness of breath and reconsideration of sleep apnea workup  Benign hypertension Office blood pressures are not at goal. Home blood pressures are also not at goal. He may be experiencing shortness of breath due to uncontrolled hypertension but ischemic substrate cannot be entirely ruled out. Medication changes as discussed above. Continue amlodipine 10 mg p.o. daily. Continue chlorthalidone 25 mg p.o. daily. Continue Jardiance 10 mg p.o. daily  Prediabetes Last hemoglobin A1c 6.2. Follows up with primary team Will check lipids as discussed above, will await initiating statin therapy until the labs are back.  Former smoker Reemphasized importance of continued complete smoking cessation  Class 2 obesity due to excess calories without serious comorbidity with body mass index (BMI) of 36.0 to 36.9 in adult Body mass index is 36.08 kg/m. I reviewed with him importance of diet, regular physical activity/exercise, weight loss.   Until his workup is not complete avoid over exertional activities.  Discussed management of ongoing chest pain and shortness of breath concerning for anginal equivalents, arranging a left heart catheterization with possible intervention, uptitration of pharmacological therapy, labs ordered, and also recommended to seek medical attention by going to the ER for hospital level care if symptoms worsen in intensity frequency or duration.  Orders Placed:  Orders Placed This Encounter  Procedures   Comp Met (CMET)   CBC   Lipid Profile   Lipoprotein A (LPA)    Standing Status:   Future    Expiration Date:   04/06/2025   Direct LDL   EKG 12-Lead   ECHOCARDIOGRAM COMPLETE     Standing Status:   Future    Expected Date:   04/13/2024    Expiration Date:   04/06/2025    [facility]    Does the patient weigh less than or greater than 250 lbs?:   Patient weighs less than 250 lbs    Perflutren DEFINITY (image enhancing agent) should be administered unless hypersensitivity or allergy exist:   Administer Perflutren    Reason for exam-Echo:   Other-Full Diagnosis List    Full ICD-10/Reason for Exam:   Angina pectoris [255214]     Final Medication List:    Meds ordered this encounter  Medications   aspirin  EC 81 MG tablet    Sig: Take 1 tablet (81 mg total) by mouth daily. Swallow whole.    Dispense:  30 tablet    Refill:  12   isosorbide mononitrate (IMDUR) 30 MG 24 hr tablet    Sig: Take 1 tablet (30 mg total) by mouth at bedtime.    Dispense:  90 tablet    Refill:  3   metoprolol succinate (TOPROL XL) 25 MG 24 hr tablet  Sig: Take 1 tablet (25 mg total) by mouth every morning.    Dispense:  90 tablet    Refill:  3   nitroGLYCERIN (NITROSTAT) 0.4 MG SL tablet    Sig: Place 1 tablet (0.4 mg total) under the tongue every 5 (five) minutes as needed for chest pain.    Dispense:  25 tablet    Refill:  3    Medications Discontinued During This Encounter  Medication Reason   HYDROcodone -acetaminophen  (NORCO) 7.5-325 MG tablet Patient Preference   ramipril (ALTACE) 5 MG capsule Patient Preference   oxyCODONE -acetaminophen  (PERCOCET) 10-325 MG per tablet Patient Preference     Current Outpatient Medications:    ALPRAZolam (XANAX) 1 MG tablet, Take 1 mg by mouth 4 (four) times daily., Disp: , Rfl:    amLODipine (NORVASC) 10 MG tablet, Take 10 mg by mouth daily., Disp: , Rfl:    aspirin  EC 81 MG tablet, Take 1 tablet (81 mg total) by mouth daily. Swallow whole., Disp: 30 tablet, Rfl: 12   budesonide-glycopyrrolate-formoterol (BREZTRI AEROSPHERE) 160-9-4.8 MCG/ACT AERO inhaler, Inhale 2 puffs into the lungs., Disp: ,  Rfl:    chlorthalidone (HYGROTON) 25 MG tablet, Take 25 mg by mouth daily., Disp: , Rfl:    cyclobenzaprine  (FLEXERIL ) 10 MG tablet, Take 10 mg by mouth at bedtime., Disp: , Rfl:    ibuprofen  (ADVIL ,MOTRIN ) 600 MG tablet, Take 1 tablet (600 mg total) by mouth every 6 (six) hours as needed., Disp: 30 tablet, Rfl: 0   isosorbide mononitrate (IMDUR) 30 MG 24 hr tablet, Take 1 tablet (30 mg total) by mouth at bedtime., Disp: 90 tablet, Rfl: 3   metoprolol succinate (TOPROL XL) 25 MG 24 hr tablet, Take 1 tablet (25 mg total) by mouth every morning., Disp: 90 tablet, Rfl: 3   nitroGLYCERIN (NITROSTAT) 0.4 MG SL tablet, Place 1 tablet (0.4 mg total) under the tongue every 5 (five) minutes as needed for chest pain., Disp: 25 tablet, Rfl: 3   cariprazine (VRAYLAR) 1.5 MG capsule, Take 1.5 mg by mouth daily. (Patient not taking: Reported on 04/06/2024), Disp: , Rfl:    empagliflozin (JARDIANCE) 10 MG TABS tablet, Take 10 mg by mouth daily. (Patient not taking: Reported on 04/06/2024), Disp: , Rfl:   Consent:   Informed Consent   Shared Decision Making/Informed Consent The risks [stroke (1 in 1000), death (1 in 1000), kidney failure [usually temporary] (1 in 500), bleeding (1 in 200), allergic reaction [possibly serious] (1 in 200)], benefits (diagnostic support and management of coronary artery disease) and alternatives of a cardiac catheterization were discussed in detail with Mr. Stradling and he is willing to proceed.     Disposition:   2 weeks with APP status post heart catheterization. 3 months with myself sooner if needed. Patient may be asked to follow-up sooner based on the results of the above-mentioned testing.  His questions and concerns were addressed to his satisfaction. He voices understanding of the recommendations provided during this encounter.    Signed, Madonna Michele HAS, [facility]  A Division of [facility] [address] [place], [place] [address]   04/06/2024 12:45 PM

## 2024-04-06 NOTE — Patient Instructions (Signed)
 Medication Instructions:  Your physician has recommended you make the following change in your medication:   START Toprol XL 25 taking 1 every morning  START Imdur (Isosorbide) 30 mg taking 1 every night  START Aspirin  81 mg taking 1 daily  START Nitroglycerin 0.4 s/l tablet ONLY AS NEEDED.. The proper use and anticipated side effects of nitroglycerine has been carefully explained.  If a single episode of chest pain is not relieved by one tablet, the patient will try another within 5 minutes; and if this doesn't relieve the pain, the patient is instructed to call 911 for transportation to an emergency department.  *If you need a refill on your cardiac medications before your next appointment, please call your pharmacy*  Lab Work: TODAY:  CMET, CBC, LIPID, DIRECT LDL, & LPa  If you have labs (blood work) drawn today and your tests are completely normal, you will receive your results only by: MyChart Message (if you have MyChart) OR A paper copy in the mail If you have any lab test that is abnormal or we need to change your treatment, we will call you to review the results.  Testing/Procedures: Your physician has requested that you have an echocardiogram. Echocardiography is a painless test that uses sound waves to create images of your heart. It provides your doctor with information about the size and shape of your heart and how well your heart's chambers and valves are working. This procedure takes approximately one hour. There are no restrictions for this procedure. Please do NOT wear cologne, perfume, aftershave, or lotions (deodorant is allowed). Please arrive 15 minutes prior to your appointment time.  Please note: We ask at that you not bring children with you during ultrasound (echo/ vascular) testing. Due to room size and safety concerns, children are not allowed in the ultrasound rooms during exams. Our front office staff cannot provide observation of children in our lobby area while  testing is being conducted. An adult accompanying a patient to their appointment will only be allowed in the ultrasound room at the discretion of the ultrasound technician under special circumstances. We apologize for any inconvenience.    Your physician has requested that you have a cardiac catheterization. Cardiac catheterization is used to diagnose and/or treat various heart conditions. Doctors may recommend this procedure for a number of different reasons. The most common reason is to evaluate chest pain. Chest pain can be a symptom of coronary artery disease (CAD), and cardiac catheterization can show whether plaque is narrowing or blocking your heart's arteries. This procedure is also used to evaluate the valves, as well as measure the blood flow and oxygen levels in different parts of your heart. For further information please visit [email or web address] Please follow instruction sheet, BELOW:        Cardiac/Peripheral Catheterization   You are scheduled for a Cardiac Catheterization on Thursday, October 9 with Dr. Lonni End.  1. Please arrive at the [facility] (Main Entrance [facility] at [facility]: [address] [place], [place] [address] at 10:00 AM (This time is 2 hour(s) before your procedure to ensure your preparation).   Free valet parking service is available. You will check in at ADMITTING. The support person will be asked to wait in the waiting room.  It is OK to have someone drop you off and come back when you are ready to be discharged.        Special note: Every effort is made to have your procedure done on time. Please  understand that emergencies sometimes delay scheduled procedures.  2. Diet: Nothing to eat after midnight.  3. Hydration:You need to be well hydrated before your procedure. On October 9, you may drink approved liquids (see below) until 2 hours before the procedure, with 16 oz of water as your last intake.   List of approved liquids water, clear  juice, clear tea, black coffee, fruit juices, non-citric and without pulp, carbonated beverages, Gatorade, Kool -Aid, plain Jello-O and plain ice popsicles.  4. Labs: You will need to have blood drawn on TODAY  5. Medication instructions in preparation for your procedure:   Contrast Allergy: No    On the morning of your procedure, take Aspirin  81 mg and any morning medicines NOT listed above.  You may use sips of water.  6. Plan to go home the same day, you will only stay overnight if medically necessary. 7. You MUST have a responsible adult to drive you home. 8. An adult MUST be with you the first 24 hours after you arrive home. 9. Bring a current list of your medications, and the last time and date medication taken. 10. Bring ID and current insurance cards. 11.Please wear clothes that are easy to get on and off and wear slip-on shoes.  Thank you for allowing us  to care for you!   -- [facility]   Follow-Up: At [facility], you and your health needs are our priority.  As part of our continuing mission to provide you with exceptional heart care, our providers are all part of one team.  This team includes your primary Cardiologist (physician) and Advanced Practice Providers or APPs (Physician Assistants and Nurse Practitioners) who all work together to provide you with the care you need, when you need it.  Your next appointment:   4 week(s)  Provider:   Madonna Large, DO    We recommend signing up for the patient portal called MyChart.  Sign up information is provided on this After Visit Summary.  MyChart is used to connect with patients for Virtual Visits (Telemedicine).  Patients are able to view lab/test results, encounter notes, upcoming appointments, etc.  Non-urgent messages can be sent to your provider as well.   To learn more about what you can do with MyChart, go to [email or web address].   Other Instructions

## 2024-04-06 NOTE — H&P (View-Only) (Signed)
 Cardiology Office Note:    Date:  04/06/2024  NAME:  Samuel Grant    MRN: [id number] DOB:  1956/02/21   PCP:  Renato Dorothey HERO, NP  Former Cardiology Providers: Dr. Ezra Shuck Primary Cardiologist:  Madonna Large, DO, [facility] (established care 04/06/2024) Electrophysiologist:  None   Referring MD: Towana Montie SQUIBB, DO  Reason of Consult: Chest pain   Chief Complaint  Patient presents with   New Patient (Initial Visit)    Chest pain and shortness of breath    History of Present Illness:    Samuel Grant is a 68 y.o. Caucasian male whose past medical history and cardiovascular risk factors includes: Hypertension, family history of premature coronary artery disease, degenerative disc disease, depression/anxiety, sleep difficulties, bruxism. He is being seen today for the evaluation of chest pain and shortness of breath at the request of Towana Montie SQUIBB, DO.  Chest Pain:  Left sided and substernal Onging for 3 months; no change in intensity and duration But more frequent now than three months ago No ER visit states I am hard headed / stubborn. Intensity: 5/10 Duration: minutes  Worse: with exertion (was walking 3 miles last year, now only able to walk 0.5 miles before he becomes symptomatic) Better: with resting and at times self-limited. Last episode yesterday night.  Shortness of breath:  Duration: 4 months  + PND, orthopnea,  No leg swelling  No history of congestive heart failure, remote history of smoking Primary referred him for sleep apnea but patient states his out-of-pocket portion is cost prohibitive  Notes elevated BP at home 140-160 mmHG.   Patient states that his brother was born prematurely, had congenital heart disease, had to undergo multiple surgeries, and passed away in his early 32s.    Current Medications: Current Meds  Medication Sig   ALPRAZolam (XANAX) 1 MG tablet Take 1 mg by mouth 4 (four) times daily.   amLODipine (NORVASC) 10 MG  tablet Take 10 mg by mouth daily.   aspirin  EC 81 MG tablet Take 1 tablet (81 mg total) by mouth daily. Swallow whole.   budesonide-glycopyrrolate-formoterol (BREZTRI AEROSPHERE) 160-9-4.8 MCG/ACT AERO inhaler Inhale 2 puffs into the lungs.   chlorthalidone (HYGROTON) 25 MG tablet Take 25 mg by mouth daily.   cyclobenzaprine  (FLEXERIL ) 10 MG tablet Take 10 mg by mouth at bedtime.   ibuprofen  (ADVIL ,MOTRIN ) 600 MG tablet Take 1 tablet (600 mg total) by mouth every 6 (six) hours as needed.   isosorbide mononitrate (IMDUR) 30 MG 24 hr tablet Take 1 tablet (30 mg total) by mouth at bedtime.   metoprolol succinate (TOPROL XL) 25 MG 24 hr tablet Take 1 tablet (25 mg total) by mouth every morning.   nitroGLYCERIN (NITROSTAT) 0.4 MG SL tablet Place 1 tablet (0.4 mg total) under the tongue every 5 (five) minutes as needed for chest pain.     Allergies:    Penicillins   Past Medical History: Past Medical History:  Diagnosis Date   Chronic pain    DDD (degenerative disc disease)    Depression with anxiety    DJD (degenerative joint disease) of knee    Elevated blood pressure    Hypertension    Somatic dysfunction     Past Surgical History: History reviewed. No pertinent surgical history. Shoulder surgery   Social History: Social History   Tobacco Use   Smoking status: Former    Current packs/day: 0.00    Average packs/day: 3.0 packs/day for 20.0 years (60.0 ttl  pk-yrs)    Types: Cigarettes    Start date: 12/28/1952    Quit date: 12/28/1972    Years since quitting: 51.3   Smokeless tobacco: Never  Substance Use Topics   Alcohol use: No   Drug use: No    Family History: Family History  Problem Relation Age of Onset   Healthy Mother    Tuberculosis Father     ROS:   Review of Systems  Cardiovascular:  Positive for chest pain and dyspnea on exertion. Negative for claudication, irregular heartbeat, leg swelling, near-syncope, orthopnea, palpitations, paroxysmal nocturnal dyspnea  and syncope.  Respiratory:  Positive for shortness of breath.   Hematologic/Lymphatic: Negative for bleeding problem.    EKGs/Labs/Other Studies Reviewed:   EKG: EKG Interpretation Date/Time:  Wednesday April 06 2024 10:34:23 EDT Ventricular Rate:  98 PR Interval:  160 QRS Duration:  88 QT Interval:  358 QTC Calculation: 457 R Axis:   70  Text Interpretation: Normal sinus rhythm When compared with ECG of 03-Jan-2013 11:05, Since last tracing rate slower Confirmed by Michele Richardson [id number]) on 04/06/2024 11:01:36 AM  Echocardiogram: 12/2012: LVEF 55 to 60%, no significant valvular heart disease, see report for additional details   Labs: Hemoglobin A1c Reviewed date:03/09/2024 10:53:16 AM Interpretation: Performing Oja:Ojarnme [place], [address], [place], Phone - [phone], Director - MDNagendra Notes/Report:  Hemoglobin A1c 6.2 4.8-5.6 % .  Prediabetes: 5.7 - 6.4  Diabetes: >6.4  Glycemic control for adults with diabetes: <7.0   CBC With Differential/Platelet Reviewed date:03/09/2024 10:53:16 AM Interpretation: Performing Oja:Ojarnme [place], [address] [place], Phone - [phone], Director - MDNagendra Notes/Report:  WBC 8.2 3.4-10.8 x10E3/uL    RBC 5.45 4.14-5.80 x10E6/uL    Hemoglobin 16.9 13.0-17.7 g/dL    Hematocrit 49.7 62.4-48.9 %    MCV 92 79-97 fL    MCH 31.0 26.6-33.0 pg    MCHC 33.7 31.5-35.7 g/dL    RDW 87.1 88.3-84.5 %    Platelets 419 150-450 x10E3/uL    Neutrophils 47 Not Estab. %    Lymphs 36 Not Estab. %    Monocytes 9 Not Estab. %    Eos 7 Not Estab. %    Basos 1 Not Estab. %    Immature Cells        Neutrophils (Absolute) 3.9 1.4-7.0 x10E3/uL    Lymphs (Absolute) 2.9 0.7-3.1 x10E3/uL    Monocytes(Absolute) 0.7 0.1-0.9 x10E3/uL    Eos (Absolute) 0.5 0.0-0.4 x10E3/uL    Baso (Absolute) 0.1 0.0-0.2 x10E3/uL    Immature Granulocytes 0 Not Estab. %    Immature Grans (Abs) 0.0 0.0-0.1 x10E3/uL    NRBC        Hematology  Comments:        Comp. Metabolic Panel (14) Reviewed date:03/09/2024 10:53:16 AM Interpretation: Performing Oja:Ojarnme , [address], [place], Phone - [phone], Director - MDNagendra Notes/Report:  Glucose 112 70-99 mg/dL    BUN 17 1-72 mg/dL    Creatinine 9.04 9.23-8.72 mg/dL    eGFR 88 >40 fO/fpw/8.26    BUN/Creatinine Ratio 18 10-24    Sodium 138 134-144 mmol/L    Potassium 3.8 3.5-5.2 mmol/L    Chloride 94 96-106 mmol/L    Carbon Dioxide, Total 27 20-29 mmol/L    Calcium 9.8 8.6-10.2 mg/dL    Protein, Total 7.8 3.9-1.4 g/dL    Albumin 4.6 6.0-5.0 g/dL    Globulin, Total 3.2 1.5-4.5 g/dL    Bilirubin, Total 0.5 0.0-1.2 mg/dL    Alkaline Phosphatase 77 44-121 IU/L **Effective March 14, 2024  Alkaline Phosphatase**  reference interval will be changing to:  Age Male Male  0 - 5 days 47 - 127 47 - 127  6 - 10 days 29 - 242 29 - 242  11 - 20 days 109 - 357 109 - 357  21 - 30 days 94 - 494 94 - 494  1 - 2 months 149 - 539 149 - 539  3 - 6 months 131 - 452 131 - 452  7 - 11 months 117 - 401 117 - 401  12 months - 6 years 158 - 369 158 - 369  7 - 12 years 150 - 409 150 - 409  13 years 156 - 435 78 - 227  14 years 114 - 375 64 - 161  15 years 88 - 279 56 - 134  16 years 74 - 207 51 - 121  17 years 63 - 161 47 - 113  18 - 20 years 51 - 125 42 - 106  21 - 50 years 47 - 123 41 - 116  51 - 80 years 49 - 135 51 - 125  >80 years 48 - 129 48 - 129   AST (SGOT) 24 0-40 IU/L    ALT (SGPT) 32 0-44 IU/L      Physical Exam:    Today's Vitals   04/06/24 1037  BP: (!) 159/89  Pulse: 96  Resp: 16  SpO2: 96%  Weight: 266 lb (120.7 kg)  Height: 6' (1.829 m)   Body mass index is 36.08 kg/m. Wt Readings from Last 3 Encounters:  04/06/24 266 lb (120.7 kg)  01/13/18 237 lb (107.5 kg)  12/14/17 235 lb (106.6 kg)    Physical Exam  Constitutional: No distress.  hemodynamically stable  Neck: No JVD present.  Cardiovascular: Normal rate, regular rhythm, S1  normal and S2 normal. Exam reveals no gallop, no S3 and no S4.  No murmur heard. Pulmonary/Chest: Effort normal and breath sounds normal. No stridor. He has no wheezes. He has no rales.  Musculoskeletal:        General: No edema.     Cervical back: Neck supple.  Skin: Skin is warm.     Impression & Recommendation(s):  Impression:   ICD-10-CM   1. Angina pectoris  I20.9 EKG 12-Lead    Comp Met (CMET)    CBC    Lipid Profile    Lipoprotein A (LPA)    Direct LDL    ECHOCARDIOGRAM COMPLETE    2. Shortness of breath  R06.02 Comp Met (CMET)    CBC    Lipid Profile    Lipoprotein A (LPA)    Direct LDL    ECHOCARDIOGRAM COMPLETE    3. Benign hypertension  I10     4. Prediabetes  R73.03     5. Former smoker  Z87.891     6. Class 2 obesity due to excess calories without serious comorbidity with body mass index (BMI) of 36.0 to 36.9 in adult  E66.812    E66.09    Z68.36        Recommendation(s):  Angina pectoris Shortness of breath Symptoms concerning for cardiac discomfort. Ongoing since July 2025, progressive EKG is nonischemic. Risk factors: Former smoker, prediabetes, family history Recommended coronary CTA versus left heart catheterization; however, patient would like to proceed with the latter to make sure he does not have obstructive disease and if needed avoid a second procedure.  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 Risks, benefits, and alternatives, and limitations discussed. Start aspirin  81 mg p.o. daily until the workup is incomplete. Start Toprol-XL 25 mg p.o. every morning. Start Imdur 30 mg p.o. every afternoon Sublingual nitroglycerin tablets to use on a as needed basis, patient is advised to seek medical attention after the second sublingual nitroglycerin tablet. Echo will be ordered to evaluate for structural heart disease and left ventricular systolic function. Left heart catheterization with possible  intervention Patient is nonfasting and lives in [place]  approximately 40 to 45 minutes away.  Patient is requesting labs to be done today as opposed to coming back.  Check CMP, lipids, direct LDL, LP(a), and CBC. Recommended that he follows up with PCP for evaluation of noncardiac causes of shortness of breath and reconsideration of sleep apnea workup  Benign hypertension Office blood pressures are not at goal. Home blood pressures are also not at goal. He may be experiencing shortness of breath due to uncontrolled hypertension but ischemic substrate cannot be entirely ruled out. Medication changes as discussed above. Continue amlodipine 10 mg p.o. daily. Continue chlorthalidone 25 mg p.o. daily. Continue Jardiance 10 mg p.o. daily  Prediabetes Last hemoglobin A1c 6.2. Follows up with primary team Will check lipids as discussed above, will await initiating statin therapy until the labs are back.  Former smoker Reemphasized importance of continued complete smoking cessation  Class 2 obesity due to excess calories without serious comorbidity with body mass index (BMI) of 36.0 to 36.9 in adult Body mass index is 36.08 kg/m. I reviewed with him importance of diet, regular physical activity/exercise, weight loss.   Until his workup is not complete avoid over exertional activities.  Discussed management of ongoing chest pain and shortness of breath concerning for anginal equivalents, arranging a left heart catheterization with possible intervention, uptitration of pharmacological therapy, labs ordered, and also recommended to seek medical attention by going to the ER for hospital level care if symptoms worsen in intensity frequency or duration.  Orders Placed:  Orders Placed This Encounter  Procedures   Comp Met (CMET)   CBC   Lipid Profile   Lipoprotein A (LPA)    Standing Status:   Future    Expiration Date:   04/06/2025   Direct LDL   EKG 12-Lead   ECHOCARDIOGRAM COMPLETE     Standing Status:   Future    Expected Date:   04/13/2024    Expiration Date:   04/06/2025    [facility]    Does the patient weigh less than or greater than 250 lbs?:   Patient weighs less than 250 lbs    Perflutren DEFINITY (image enhancing agent) should be administered unless hypersensitivity or allergy exist:   Administer Perflutren    Reason for exam-Echo:   Other-Full Diagnosis List    Full ICD-10/Reason for Exam:   Angina pectoris [255214]     Final Medication List:    Meds ordered this encounter  Medications   aspirin  EC 81 MG tablet    Sig: Take 1 tablet (81 mg total) by mouth daily. Swallow whole.    Dispense:  30 tablet    Refill:  12   isosorbide mononitrate (IMDUR) 30 MG 24 hr tablet    Sig: Take 1 tablet (30 mg total) by mouth at bedtime.    Dispense:  90 tablet    Refill:  3   metoprolol succinate (TOPROL XL) 25 MG 24 hr tablet  Sig: Take 1 tablet (25 mg total) by mouth every morning.    Dispense:  90 tablet    Refill:  3   nitroGLYCERIN (NITROSTAT) 0.4 MG SL tablet    Sig: Place 1 tablet (0.4 mg total) under the tongue every 5 (five) minutes as needed for chest pain.    Dispense:  25 tablet    Refill:  3    Medications Discontinued During This Encounter  Medication Reason   HYDROcodone -acetaminophen  (NORCO) 7.5-325 MG tablet Patient Preference   ramipril (ALTACE) 5 MG capsule Patient Preference   oxyCODONE -acetaminophen  (PERCOCET) 10-325 MG per tablet Patient Preference     Current Outpatient Medications:    ALPRAZolam (XANAX) 1 MG tablet, Take 1 mg by mouth 4 (four) times daily., Disp: , Rfl:    amLODipine (NORVASC) 10 MG tablet, Take 10 mg by mouth daily., Disp: , Rfl:    aspirin  EC 81 MG tablet, Take 1 tablet (81 mg total) by mouth daily. Swallow whole., Disp: 30 tablet, Rfl: 12   budesonide-glycopyrrolate-formoterol (BREZTRI AEROSPHERE) 160-9-4.8 MCG/ACT AERO inhaler, Inhale 2 puffs into the lungs., Disp: ,  Rfl:    chlorthalidone (HYGROTON) 25 MG tablet, Take 25 mg by mouth daily., Disp: , Rfl:    cyclobenzaprine  (FLEXERIL ) 10 MG tablet, Take 10 mg by mouth at bedtime., Disp: , Rfl:    ibuprofen  (ADVIL ,MOTRIN ) 600 MG tablet, Take 1 tablet (600 mg total) by mouth every 6 (six) hours as needed., Disp: 30 tablet, Rfl: 0   isosorbide mononitrate (IMDUR) 30 MG 24 hr tablet, Take 1 tablet (30 mg total) by mouth at bedtime., Disp: 90 tablet, Rfl: 3   metoprolol succinate (TOPROL XL) 25 MG 24 hr tablet, Take 1 tablet (25 mg total) by mouth every morning., Disp: 90 tablet, Rfl: 3   nitroGLYCERIN (NITROSTAT) 0.4 MG SL tablet, Place 1 tablet (0.4 mg total) under the tongue every 5 (five) minutes as needed for chest pain., Disp: 25 tablet, Rfl: 3   cariprazine (VRAYLAR) 1.5 MG capsule, Take 1.5 mg by mouth daily. (Patient not taking: Reported on 04/06/2024), Disp: , Rfl:    empagliflozin (JARDIANCE) 10 MG TABS tablet, Take 10 mg by mouth daily. (Patient not taking: Reported on 04/06/2024), Disp: , Rfl:   Consent:   Informed Consent   Shared Decision Making/Informed Consent The risks [stroke (1 in 1000), death (1 in 1000), kidney failure [usually temporary] (1 in 500), bleeding (1 in 200), allergic reaction [possibly serious] (1 in 200)], benefits (diagnostic support and management of coronary artery disease) and alternatives of a cardiac catheterization were discussed in detail with Mr. Stradling and he is willing to proceed.     Disposition:   2 weeks with APP status post heart catheterization. 3 months with myself sooner if needed. Patient may be asked to follow-up sooner based on the results of the above-mentioned testing.  His questions and concerns were addressed to his satisfaction. He voices understanding of the recommendations provided during this encounter.    Signed, Madonna Michele HAS, [facility]  A Division of [facility] [address] [place], [place] [id number]   04/06/2024 12:45 PM

## 2024-04-07 ENCOUNTER — Ambulatory Visit: Payer: Self-pay | Admitting: Cardiology

## 2024-04-07 ENCOUNTER — Encounter (HOSPITAL_COMMUNITY)
Admission: RE | Disposition: A | Discharge status: Home / Self Care | Payer: Self-pay | Source: Home / Self Care | Attending: Internal Medicine

## 2024-04-07 ENCOUNTER — Ambulatory Visit (HOSPITAL_COMMUNITY)
Admission: RE | Admit: 2024-04-07 | Discharge: 2024-04-07 | Disposition: A | Discharge status: Home / Self Care | Attending: Internal Medicine | Admitting: Internal Medicine

## 2024-04-07 ENCOUNTER — Other Ambulatory Visit: Payer: Self-pay

## 2024-04-07 DIAGNOSIS — E7800 Pure hypercholesterolemia, unspecified: Secondary | ICD-10-CM

## 2024-04-07 DIAGNOSIS — I200 Unstable angina: Secondary | ICD-10-CM | POA: Diagnosis present

## 2024-04-07 DIAGNOSIS — I25110 Atherosclerotic heart disease of native coronary artery with unstable angina pectoris: Secondary | ICD-10-CM | POA: Diagnosis not present

## 2024-04-07 DIAGNOSIS — R7303 Prediabetes: Secondary | ICD-10-CM | POA: Diagnosis not present

## 2024-04-07 DIAGNOSIS — I10 Essential (primary) hypertension: Secondary | ICD-10-CM | POA: Insufficient documentation

## 2024-04-07 DIAGNOSIS — Z6836 Body mass index (BMI) 36.0-36.9, adult: Secondary | ICD-10-CM | POA: Diagnosis not present

## 2024-04-07 DIAGNOSIS — I25118 Atherosclerotic heart disease of native coronary artery with other forms of angina pectoris: Secondary | ICD-10-CM | POA: Diagnosis not present

## 2024-04-07 DIAGNOSIS — Z87891 Personal history of nicotine dependence: Secondary | ICD-10-CM | POA: Insufficient documentation

## 2024-04-07 DIAGNOSIS — E66812 Obesity, class 2: Secondary | ICD-10-CM | POA: Insufficient documentation

## 2024-04-07 DIAGNOSIS — Z7984 Long term (current) use of oral hypoglycemic drugs: Secondary | ICD-10-CM | POA: Diagnosis not present

## 2024-04-07 HISTORY — PX: LEFT HEART CATH AND CORONARY ANGIOGRAPHY: CATH118249

## 2024-04-07 LAB — CBC
Hematocrit: 47.9 % (ref 37.5–51.0)
Hemoglobin: 16 g/dL (ref 13.0–17.7)
MCH: 30.9 pg (ref 26.6–33.0)
MCHC: 33.4 g/dL (ref 31.5–35.7)
MCV: 93 fL (ref 79–97)
Platelets: 408 x10E3/uL (ref 150–450)
RBC: 5.17 x10E6/uL (ref 4.14–5.80)
RDW: 13.1 % (ref 11.6–15.4)
WBC: 9.5 x10E3/uL (ref 3.4–10.8)

## 2024-04-07 LAB — COMPREHENSIVE METABOLIC PANEL WITH GFR
ALT: 35 IU/L (ref 0–44)
AST: 23 IU/L (ref 0–40)
Albumin: 4.4 g/dL (ref 3.9–4.9)
Alkaline Phosphatase: 66 IU/L (ref 47–123)
BUN/Creatinine Ratio: 17 (ref 10–24)
BUN: 17 mg/dL (ref 8–27)
Bilirubin Total: 0.3 mg/dL (ref 0.0–1.2)
CO2: 23 mmol/L (ref 20–29)
Calcium: 9.9 mg/dL (ref 8.6–10.2)
Chloride: 100 mmol/L (ref 96–106)
Creatinine, Ser: 1.02 mg/dL (ref 0.76–1.27)
Globulin, Total: 2.9 g/dL (ref 1.5–4.5)
Glucose: 67 mg/dL — AB (ref 70–99)
Potassium: 4.1 mmol/L (ref 3.5–5.2)
Sodium: 140 mmol/L (ref 134–144)
Total Protein: 7.3 g/dL (ref 6.0–8.5)
eGFR: 81 mL/min/1.73 (ref >59)

## 2024-04-07 LAB — LIPID PANEL
Chol/HDL Ratio: 4.7 ratio (ref 0.0–5.0)
Cholesterol, Total: 216 mg/dL — AB (ref 100–199)
HDL: 46 mg/dL (ref >39)
LDL Chol Calc (NIH): 150 mg/dL — AB (ref 0–99)
Triglycerides: 113 mg/dL (ref 0–149)
VLDL Cholesterol Cal: 20 mg/dL (ref 5–40)

## 2024-04-07 LAB — LDL CHOLESTEROL, DIRECT: LDL Direct: 147 mg/dL — AB (ref 0–99)

## 2024-04-07 SURGERY — LEFT HEART CATH AND CORONARY ANGIOGRAPHY
Anesthesia: LOCAL

## 2024-04-07 MED ORDER — FENTANYL CITRATE (PF) 100 MCG/2ML IJ SOLN
INTRAMUSCULAR | Status: DC | PRN
  Administered 2024-04-07: 25 ug via INTRAVENOUS

## 2024-04-07 MED ORDER — LABETALOL HCL 5 MG/ML IV SOLN: 10.0000 mg | INTRAVENOUS | Status: DC | PRN

## 2024-04-07 MED ORDER — ONDANSETRON HCL 4 MG/2ML IJ SOLN: 4.0000 mg | Freq: Four times a day (QID) | INTRAMUSCULAR | Status: DC | PRN

## 2024-04-07 MED ORDER — LIDOCAINE HCL (PF) 1 % IJ SOLN
INTRAMUSCULAR | Status: AC
Start: 2024-04-07 — End: 2024-04-07
  Filled 2024-04-07: qty 30

## 2024-04-07 MED ORDER — HEPARIN SODIUM (PORCINE) 1000 UNIT/ML IJ SOLN
INTRAMUSCULAR | Status: AC
  Filled 2024-04-07: qty 10

## 2024-04-07 MED ORDER — SODIUM CHLORIDE 0.9% FLUSH: 3.0000 mL | INTRAVENOUS | Status: DC | PRN

## 2024-04-07 MED ORDER — LIDOCAINE HCL (PF) 1 % IJ SOLN
INTRAMUSCULAR | Status: DC | PRN
  Administered 2024-04-07: 2 mL via INTRADERMAL

## 2024-04-07 MED ORDER — HEPARIN (PORCINE) IN NACL 1000-0.9 UT/500ML-% IV SOLN
INTRAVENOUS | Status: DC | PRN
  Administered 2024-04-07 (×2): 500 mL

## 2024-04-07 MED ORDER — VERAPAMIL HCL 2.5 MG/ML IV SOLN
INTRAVENOUS | Status: AC
  Filled 2024-04-07: qty 2

## 2024-04-07 MED ORDER — SODIUM CHLORIDE 0.9 % IV SOLN: 250.0000 mL | INTRAVENOUS | Status: DC | PRN

## 2024-04-07 MED ORDER — MIDAZOLAM HCL 2 MG/2ML IJ SOLN
INTRAMUSCULAR | Status: DC | PRN
  Administered 2024-04-07: 1 mg via INTRAVENOUS

## 2024-04-07 MED ORDER — ASPIRIN 81 MG PO CHEW: 81.0000 mg | CHEWABLE_TABLET | ORAL | Status: DC

## 2024-04-07 MED ORDER — SODIUM CHLORIDE 0.9% FLUSH: 3.0000 mL | Freq: Two times a day (BID) | INTRAVENOUS | Status: DC

## 2024-04-07 MED ORDER — FENTANYL CITRATE (PF) 100 MCG/2ML IJ SOLN
INTRAMUSCULAR | Status: AC
  Filled 2024-04-07: qty 2

## 2024-04-07 MED ORDER — HEPARIN SODIUM (PORCINE) 1000 UNIT/ML IJ SOLN
INTRAMUSCULAR | Status: DC | PRN
  Administered 2024-04-07: 5000 [IU] via INTRAVENOUS

## 2024-04-07 MED ORDER — MIDAZOLAM HCL 2 MG/2ML IJ SOLN
INTRAMUSCULAR | Status: AC
  Filled 2024-04-07: qty 2

## 2024-04-07 MED ORDER — FREE WATER: 500.0000 mL | Freq: Once | Status: DC

## 2024-04-07 MED ORDER — HYDRALAZINE HCL 20 MG/ML IJ SOLN: 10.0000 mg | INTRAMUSCULAR | Status: DC | PRN

## 2024-04-07 MED ORDER — ACETAMINOPHEN 325 MG PO TABS: 650.0000 mg | ORAL_TABLET | ORAL | Status: DC | PRN

## 2024-04-07 MED ORDER — VERAPAMIL HCL 2.5 MG/ML IV SOLN
INTRAVENOUS | Status: DC | PRN
  Administered 2024-04-07: 10 mL via INTRA_ARTERIAL

## 2024-04-07 MED ORDER — IOHEXOL 350 MG/ML SOLN
INTRAVENOUS | Status: DC | PRN
  Administered 2024-04-07: 50 mL

## 2024-04-07 SURGICAL SUPPLY — 5 items
CATH 5FR JL3.5 JR4 ANG PIG MP (CATHETERS) IMPLANT
GLIDESHEATH SLEND SS 6F .021 (SHEATH) IMPLANT
GUIDEWIRE INQWIRE 1.5J.035X260 (WIRE) IMPLANT
PACK CARDIAC CATHETERIZATION (CUSTOM PROCEDURE TRAY) ×1 IMPLANT
SET ATX-X65L (MISCELLANEOUS) IMPLANT

## 2024-04-07 NOTE — Progress Notes (Signed)
 TR Band removed no s/s of complications at the incision site. Discharge instructions reviewed with patient and Tammy at the bedside. Verbalized understanding. PT ambulated in the hallway voided in the bathroom, tolerated PO intake prior to discharge. Pt was escorted from the unit to personal vehicle via wheel chair.

## 2024-04-07 NOTE — Interval H&P Note (Signed)
 History and Physical Interval Note:  04/07/2024 1:23 PM  Samuel Grant  has presented today for surgery, with the diagnosis of unstable angina.  The various methods of treatment have been discussed with the patient and family. After consideration of risks, benefits and other options for treatment, the patient has consented to  Procedure(s): LEFT HEART CATH AND CORONARY ANGIOGRAPHY (N/A) as a surgical intervention.  The patient's history has been reviewed, patient examined, no change in status, stable for surgery.  I have reviewed the patient's chart and labs.  Questions were answered to the patient's satisfaction.    Cath Lab Visit (complete for each Cath Lab visit)  Clinical Evaluation Leading to the Procedure:   ACS: No.  Non-ACS:    Anginal Classification: CCS IV  Anti-ischemic medical therapy: Maximal Therapy (2 or more classes of medications)  Non-Invasive Test Results: No non-invasive testing performed  Prior CABG: No previous CABG  Letesha Klecker

## 2024-04-07 NOTE — Brief Op Note (Signed)
 BRIEF CARDIAC CATHETERIZATION NOTE  04/07/2024  2:12 PM  PATIENT:  Samuel Grant  68 y.o. male  PRE-OPERATIVE DIAGNOSIS:  angina  POST-OPERATIVE DIAGNOSIS:  * No post-op diagnosis entered *  PROCEDURE:  Procedure(s): LEFT HEART CATH AND CORONARY ANGIOGRAPHY (N/A)  SURGEON:  Surgeons and Role:    DEWAINE Mady Bruckner, MD - Primary  FINDINGS: Non-obstructive CAD. Normal LVEF.  RECOMMENDATIONS: Medical tx and risk factor modification.  Bruckner Mady, MD [facility]

## 2024-04-07 NOTE — Discharge Instructions (Signed)

## 2024-04-08 ENCOUNTER — Encounter (HOSPITAL_COMMUNITY): Payer: Self-pay | Admitting: Internal Medicine

## 2024-04-08 MED ORDER — ATORVASTATIN CALCIUM 40 MG PO TABS: 40.0000 mg | ORAL_TABLET | Freq: Every day | ORAL | 3 refills | Status: AC

## 2024-04-08 NOTE — Progress Notes (Signed)
 The patient has been notified of the result and verbalized understanding. All questions (if any) were answered.   Send Lipitor 40 mg po at bedtime to [facility] [place], [place]   Ordered and release a CMP and lipids within the next 6 weeks.

## 2024-04-13 ENCOUNTER — Ambulatory Visit (HOSPITAL_COMMUNITY)
Admission: RE | Admit: 2024-04-13 | Discharge: 2024-04-13 | Disposition: A | Discharge status: Home / Self Care | Source: Ambulatory Visit | Attending: Cardiovascular Disease | Admitting: Cardiovascular Disease

## 2024-04-13 DIAGNOSIS — I209 Angina pectoris, unspecified: Secondary | ICD-10-CM | POA: Diagnosis present

## 2024-04-13 DIAGNOSIS — R0602 Shortness of breath: Secondary | ICD-10-CM | POA: Insufficient documentation

## 2024-04-13 LAB — ECHOCARDIOGRAM COMPLETE
Area-P 1/2: 3.7 cm2
S' Lateral: 3.2 cm

## 2024-04-20 ENCOUNTER — Encounter: Payer: Self-pay | Admitting: *Deleted

## 2024-04-21 ENCOUNTER — Ambulatory Visit: Admitting: Emergency Medicine

## 2024-04-21 NOTE — Progress Notes (Deleted)
  Cardiology Office Note:    Date:  04/21/2024  ID:  CLEVESTER HELZER, DOB May 16, 1956, MRN [id number] PCP: Renato Dorothey HERO, NP  [facility] Providers Cardiologist:  Madonna Large, DO { Click to update primary MD,subspecialty MD or APP then REFRESH:1}    {Click to Open Review  :1}   Patient Profile:       Chief Complaint: *** History of Present Illness:  Samuel Grant is a 68 y.o. male with visit-pertinent history of hypertension, coronary artery disease, degenerative disc disease, depression/anxiety, bruxism  He established with cardiology service on 04/06/2024 for evaluation of chest pain and shortness of breath.  Chest pain was left-sided and substernal ongoing for 3 months with no changes in intensity and duration.  Chest pain is worse with exertion as he was walking 3 miles last year and now only able to walk half a mile before becomes symptomatic.  Chest pains better with rest.  He also reports shortness of breath ongoing for the past 4 months.  He was started on aspirin  81 mg daily, metoprolol 25 mg daily, isosorbide 30 mg daily, and nitroglycerin as needed.  His blood pressure was elevated at 159/89.  He was continued on amlodipine 10 mg daily, chlorthalidone 25 mg daily, Jardiance 10 mg daily.  He underwent cardiac catheterization on 04/07/2024 showing mild-moderate, nonobstructive coronary artery disease with 30-40% distal LAD stenosis, 20% mid LCx lesion, and mild luminal irregularities throughout the RCA with normal left ventricular systolic function with LVEF greater than 65% with normal filling pressures and LVEDP 14 mmHg.  He had an echocardiogram on 04/13/2024 showing LVEF 55 to 60%, no RWMA, normal diastolic parameters, RV function and size normal, trivial MR.  Discussed the use of AI scribe software for clinical note transcription with the patient, who gave verbal consent to proceed.  History of Present Illness     Review of systems:  Please see the history of  present illness. All other systems are reviewed and otherwise negative. ***      Studies Reviewed:        ***  Risk Assessment/Calculations:   {Does this patient have ATRIAL FIBRILLATION?[phone]} No BP recorded.  {Refresh Note OR Click here to enter BP  :1}***        Physical Exam:   VS:  There were no vitals taken for this visit.   Wt Readings from Last 3 Encounters:  04/07/24 260 lb (117.9 kg)  04/06/24 266 lb (120.7 kg)  01/13/18 237 lb (107.5 kg)    GEN: Well nourished, well developed in no acute distress NECK: No JVD; No carotid bruits CARDIAC: ***RRR, no murmurs, rubs, gallops RESPIRATORY:  Clear to auscultation without rales, wheezing or rhonchi  ABDOMEN: Soft, non-tender, non-distended EXTREMITIES:  No edema; No acute deformity ***      Assessment and Plan:    Assessment and Plan Assessment & Plan      {Are you ordering a CV Procedure (e.g. stress test, cath, DCCV, TEE, etc)?   Press F2        :[id number]}  Dispo:  No follow-ups on file.  Signed, Samuel LITTIE Louis, NP

## 2024-04-29 ENCOUNTER — Encounter: Payer: Self-pay | Admitting: Emergency Medicine

## 2024-05-02 ENCOUNTER — Encounter: Payer: Self-pay | Admitting: *Deleted

## 2024-05-03 ENCOUNTER — Ambulatory Visit: Attending: Physician Assistant | Admitting: Physician Assistant

## 2024-05-03 VITALS — BP 126/84 | HR 91 | Wt 253.8 lb

## 2024-05-03 DIAGNOSIS — Z79899 Other long term (current) drug therapy: Secondary | ICD-10-CM

## 2024-05-03 DIAGNOSIS — R0609 Other forms of dyspnea: Secondary | ICD-10-CM | POA: Diagnosis not present

## 2024-05-03 DIAGNOSIS — I10 Essential (primary) hypertension: Secondary | ICD-10-CM | POA: Diagnosis not present

## 2024-05-03 DIAGNOSIS — I2510 Atherosclerotic heart disease of native coronary artery without angina pectoris: Secondary | ICD-10-CM

## 2024-05-03 DIAGNOSIS — I209 Angina pectoris, unspecified: Secondary | ICD-10-CM | POA: Diagnosis not present

## 2024-05-03 DIAGNOSIS — E785 Hyperlipidemia, unspecified: Secondary | ICD-10-CM

## 2024-05-03 NOTE — Patient Instructions (Addendum)
 Medication Instructions:  Your physician recommends that you continue on your current medications as directed. Please refer to the Current Medication list given to you today.  *If you need a refill on your cardiac medications before your next appointment, please call your pharmacy*  Lab Work: Fasting lipid panel & LFTs today  Testing/Procedures: NONE ordered at this time of appointment   Follow-Up: At [facility], you and your health needs are our priority.  As part of our continuing mission to provide you with exceptional heart care, our providers are all part of one team.  This team includes your primary Cardiologist (physician) and Advanced Practice Providers or APPs (Physician Assistants and Nurse Practitioners) who all work together to provide you with the care you need, when you need it.  Your next appointment:   6-9 month(s)  Provider:   Madonna Large, DO    We recommend signing up for the patient portal called MyChart.  Sign up information is provided on this After Visit Summary.  MyChart is used to connect with patients for Virtual Visits (Telemedicine).  Patients are able to view lab/test results, encounter notes, upcoming appointments, etc.  Non-urgent messages can be sent to your provider as well.   To learn more about what you can do with MyChart, go to [email or web address].

## 2024-05-03 NOTE — Progress Notes (Unsigned)
  Cardiology Office Note   Date:  05/04/2024  ID:  Josyah, Achor August 11, 1955, MRN [id number] PCP: Renato Dorothey HERO, NP  [facility] Providers Cardiologist:  Madonna Large, DO     History of Present Illness Samuel Grant is a 68 y.o. male with past medical history of hypertension, degenerative disc disease, anxiety/depression, sleep difficulties, prediabetes and recently diagnosed minimal coronary artery disease.  Patient was seen by Dr. Large on 04/26/2024 for shortness of breath with exertion.  Echocardiogram and cardiac catheterization were scheduled.  Cardiac catheterization showed minimal disease.  Echocardiogram showed normal EF 55 to 60%, no significant valve issue.  Patient presents today for follow-up.  His dyspnea on exertion has improved.  Overall his dyspnea on exertion has been going on for the past 8 months.  He smoked briefly in his 73s however has not smoked in the recent years.  I asked him to continue with normal activity, if shortness of breath persist, he may need a pulmonary function test to rule out pulmonary issues.  He does complain of constipation and abdominal bloating.  If symptoms persist, he will need to discuss with his PCP for additional evaluation.  I have reviewed the recent record with the patient, overall I think he is very stable from the cardiac perspective.  His LDL was elevated at 150, however he has been started on atorvastatin 40 mg daily.  He is fasting today, I will obtain a fasting lipid panel and LFT.  If LDL is still above 70, then I will uptitrate atorvastatin further.  Otherwise, he can follow-up with Dr. Large in 6 to 9 months  ROS:   Patient complains of dyspnea on exertion, he has no lower extremity edema, orthopnea or PND.  He denies any chest pain.  Studies Reviewed EKG Interpretation Date/Time:  Tuesday May 03 2024 13:34:20 EST Ventricular Rate:  86 PR Interval:  172 QRS Duration:  86 QT Interval:  374 QTC  Calculation: 447 R Axis:   44  Text Interpretation: Normal sinus rhythm Normal ECG When compared with ECG of 06-Apr-2024 10:34, No significant change was found Confirmed by Janene Boer [phone]) on 05/03/2024 2:07:29 PM     Risk Assessment/Calculations           Physical Exam VS:  BP 126/84   Pulse 91   Wt 253 lb 12.8 oz (115.1 kg)   SpO2 96%   BMI 33.48 kg/m        Wt Readings from Last 3 Encounters:  05/03/24 253 lb 12.8 oz (115.1 kg)  04/07/24 260 lb (117.9 kg)  04/06/24 266 lb (120.7 kg)    GEN: Well nourished, well developed in no acute distress NECK: No JVD; No carotid bruits CARDIAC: RRR, no murmurs, rubs, gallops RESPIRATORY:  Clear to auscultation without rales, wheezing or rhonchi  ABDOMEN: Soft, non-tender, non-distended EXTREMITIES:  No edema; No deformity   ASSESSMENT AND PLAN  Minimal CAD: Recent cardiac catheterization revealed 30 to 40% distal LAD lesion, 20% mid left circumflex lesion, mild luminal irregularities throughout the RCA.  Hypertension: Blood pressure stable  Hyperlipidemia: On atorvastatin 40 mg daily.  Obtain fasting lipid panel and LFT.  LDL goal less than 70.  Dyspnea on exertion: Recent cardiac catheterization showed minimal disease.  I recommended continue with activity.  If symptom persist, may need to consider pulmonary function test.        Dispo: Follow-up with Dr. Large in 6-9 month  Signed, Valaria Kohut, [place]

## 2024-05-04 ENCOUNTER — Ambulatory Visit: Payer: Self-pay | Admitting: Physician Assistant

## 2024-05-04 ENCOUNTER — Ambulatory Visit: Admitting: Emergency Medicine

## 2024-05-04 LAB — LIPID PANEL
Chol/HDL Ratio: 2.9 ratio (ref 0.0–5.0)
Cholesterol, Total: 118 mg/dL (ref 100–199)
HDL: 41 mg/dL (ref >39)
LDL Chol Calc (NIH): 61 mg/dL (ref 0–99)
Triglycerides: 83 mg/dL (ref 0–149)
VLDL Cholesterol Cal: 16 mg/dL (ref 5–40)

## 2024-05-04 LAB — HEPATIC FUNCTION PANEL
ALT: 40 IU/L (ref 0–44)
AST: 22 IU/L (ref 0–40)
Albumin: 4.7 g/dL (ref 3.9–4.9)
Alkaline Phosphatase: 81 IU/L (ref 47–123)
Bilirubin Total: 0.6 mg/dL (ref 0.0–1.2)
Bilirubin, Direct: 0.18 mg/dL (ref 0.00–0.40)
Total Protein: 8 g/dL (ref 6.0–8.5)

## 2024-07-07 ENCOUNTER — Ambulatory Visit: Attending: Cardiology | Admitting: Cardiology
